# Patient Record
Sex: Female | Born: 1965
Health system: Southern US, Community
[De-identification: ages and names within clinical notes are randomized; demographics above are authoritative.]

## PROBLEM LIST (undated history)

## (undated) DIAGNOSIS — Z801 Family history of malignant neoplasm of trachea, bronchus and lung: Secondary | ICD-10-CM

## (undated) DIAGNOSIS — Z800 Family history of malignant neoplasm of digestive organs: Secondary | ICD-10-CM

## (undated) DIAGNOSIS — Z803 Family history of malignant neoplasm of breast: Secondary | ICD-10-CM

## (undated) DIAGNOSIS — E039 Hypothyroidism, unspecified: Secondary | ICD-10-CM

## (undated) DIAGNOSIS — R079 Chest pain, unspecified: Secondary | ICD-10-CM

## (undated) HISTORY — DX: Family history of malignant neoplasm of digestive organs: Z80.0

## (undated) HISTORY — DX: Family history of malignant neoplasm of breast: Z80.3

## (undated) HISTORY — DX: Family history of malignant neoplasm of trachea, bronchus and lung: Z80.1

---

## 1999-03-11 ENCOUNTER — Inpatient Hospital Stay (HOSPITAL_COMMUNITY): Admission: AD | Admit: 1999-03-11 | Discharge: 1999-03-14 | Payer: Self-pay | Admitting: Obstetrics and Gynecology

## 2001-08-02 ENCOUNTER — Other Ambulatory Visit: Admission: RE | Admit: 2001-08-02 | Discharge: 2001-08-02 | Payer: Self-pay | Admitting: Obstetrics and Gynecology

## 2001-09-29 ENCOUNTER — Ambulatory Visit (HOSPITAL_COMMUNITY): Admission: RE | Admit: 2001-09-29 | Discharge: 2001-09-29 | Payer: Self-pay | Admitting: Gastroenterology

## 2001-10-31 ENCOUNTER — Encounter: Admission: RE | Admit: 2001-10-31 | Discharge: 2002-01-29 | Payer: Self-pay | Admitting: Gastroenterology

## 2002-08-14 ENCOUNTER — Other Ambulatory Visit: Admission: RE | Admit: 2002-08-14 | Discharge: 2002-08-14 | Payer: Self-pay | Admitting: Obstetrics and Gynecology

## 2003-11-14 ENCOUNTER — Other Ambulatory Visit: Admission: RE | Admit: 2003-11-14 | Discharge: 2003-11-14 | Payer: Self-pay | Admitting: Obstetrics and Gynecology

## 2004-12-05 ENCOUNTER — Other Ambulatory Visit: Admission: RE | Admit: 2004-12-05 | Discharge: 2004-12-05 | Payer: Self-pay | Admitting: Obstetrics and Gynecology

## 2012-06-08 ENCOUNTER — Other Ambulatory Visit: Payer: Self-pay | Admitting: Family Medicine

## 2012-06-08 DIAGNOSIS — M79606 Pain in leg, unspecified: Secondary | ICD-10-CM

## 2012-06-13 ENCOUNTER — Ambulatory Visit
Admission: RE | Admit: 2012-06-13 | Discharge: 2012-06-13 | Disposition: A | Discharge status: Home / Self Care | Payer: Managed Care, Other (non HMO) | Source: Ambulatory Visit | Attending: Family Medicine | Admitting: Family Medicine

## 2012-06-13 DIAGNOSIS — M79606 Pain in leg, unspecified: Secondary | ICD-10-CM

## 2012-07-06 ENCOUNTER — Emergency Department (HOSPITAL_COMMUNITY): Payer: No Typology Code available for payment source

## 2012-07-06 ENCOUNTER — Emergency Department (HOSPITAL_COMMUNITY)
Admission: EM | Admit: 2012-07-06 | Discharge: 2012-07-06 | Disposition: A | Discharge status: Home / Self Care | Payer: No Typology Code available for payment source | Attending: Emergency Medicine | Admitting: Emergency Medicine

## 2012-07-06 DIAGNOSIS — S161XXA Strain of muscle, fascia and tendon at neck level, initial encounter: Secondary | ICD-10-CM

## 2012-07-06 DIAGNOSIS — S0990XA Unspecified injury of head, initial encounter: Secondary | ICD-10-CM | POA: Insufficient documentation

## 2012-07-06 DIAGNOSIS — S139XXA Sprain of joints and ligaments of unspecified parts of neck, initial encounter: Secondary | ICD-10-CM | POA: Insufficient documentation

## 2012-07-06 DIAGNOSIS — Y92410 Unspecified street and highway as the place of occurrence of the external cause: Secondary | ICD-10-CM | POA: Insufficient documentation

## 2012-07-06 DIAGNOSIS — Y9389 Activity, other specified: Secondary | ICD-10-CM | POA: Insufficient documentation

## 2012-07-06 MED ORDER — IBUPROFEN 800 MG PO TABS: 800.0000 mg | ORAL_TABLET | Freq: Three times a day (TID) | ORAL | Status: DC | PRN

## 2012-07-06 MED ORDER — HYDROCODONE-ACETAMINOPHEN 5-325 MG PO TABS: 1.0000 | ORAL_TABLET | Freq: Four times a day (QID) | ORAL | Status: DC | PRN
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
## 2012-07-06 NOTE — ED Provider Notes (Signed)
Medical screening examination/treatment/procedure(s) were performed by non-physician practitioner and as supervising physician I was immediately available for consultation/collaboration.  Shaleigh Laubscher, MD 07/06/12 2112 

## 2014-06-11 ENCOUNTER — Other Ambulatory Visit: Payer: Self-pay | Admitting: Obstetrics and Gynecology

## 2014-06-12 LAB — CYTOLOGY - PAP

## 2015-10-22 ENCOUNTER — Encounter (INDEPENDENT_AMBULATORY_CARE_PROVIDER_SITE_OTHER): Payer: Self-pay

## 2015-11-11 DIAGNOSIS — Z30433 Encounter for removal and reinsertion of intrauterine contraceptive device: Secondary | ICD-10-CM | POA: Diagnosis not present

## 2015-11-21 DIAGNOSIS — D225 Melanocytic nevi of trunk: Secondary | ICD-10-CM | POA: Diagnosis not present

## 2015-11-21 DIAGNOSIS — L719 Rosacea, unspecified: Secondary | ICD-10-CM | POA: Diagnosis not present

## 2015-11-21 DIAGNOSIS — Z86018 Personal history of other benign neoplasm: Secondary | ICD-10-CM | POA: Diagnosis not present

## 2015-12-27 DIAGNOSIS — D509 Iron deficiency anemia, unspecified: Secondary | ICD-10-CM | POA: Diagnosis not present

## 2015-12-27 DIAGNOSIS — N939 Abnormal uterine and vaginal bleeding, unspecified: Secondary | ICD-10-CM | POA: Diagnosis not present

## 2015-12-27 DIAGNOSIS — N951 Menopausal and female climacteric states: Secondary | ICD-10-CM | POA: Diagnosis not present

## 2015-12-27 DIAGNOSIS — Z1329 Encounter for screening for other suspected endocrine disorder: Secondary | ICD-10-CM | POA: Diagnosis not present

## 2015-12-27 DIAGNOSIS — R5383 Other fatigue: Secondary | ICD-10-CM | POA: Diagnosis not present

## 2016-01-03 DIAGNOSIS — S0502XA Injury of conjunctiva and corneal abrasion without foreign body, left eye, initial encounter: Secondary | ICD-10-CM | POA: Diagnosis not present

## 2016-02-11 DIAGNOSIS — Z1211 Encounter for screening for malignant neoplasm of colon: Secondary | ICD-10-CM | POA: Diagnosis not present

## 2016-02-11 DIAGNOSIS — D509 Iron deficiency anemia, unspecified: Secondary | ICD-10-CM | POA: Diagnosis not present

## 2016-02-11 DIAGNOSIS — K900 Celiac disease: Secondary | ICD-10-CM | POA: Diagnosis not present

## 2016-02-11 DIAGNOSIS — R194 Change in bowel habit: Secondary | ICD-10-CM | POA: Diagnosis not present

## 2016-03-30 DIAGNOSIS — M9905 Segmental and somatic dysfunction of pelvic region: Secondary | ICD-10-CM | POA: Diagnosis not present

## 2016-03-30 DIAGNOSIS — M9904 Segmental and somatic dysfunction of sacral region: Secondary | ICD-10-CM | POA: Diagnosis not present

## 2016-03-30 DIAGNOSIS — M9903 Segmental and somatic dysfunction of lumbar region: Secondary | ICD-10-CM | POA: Diagnosis not present

## 2016-03-30 DIAGNOSIS — M545 Low back pain: Secondary | ICD-10-CM | POA: Diagnosis not present

## 2016-04-02 DIAGNOSIS — M9904 Segmental and somatic dysfunction of sacral region: Secondary | ICD-10-CM | POA: Diagnosis not present

## 2016-04-02 DIAGNOSIS — M9905 Segmental and somatic dysfunction of pelvic region: Secondary | ICD-10-CM | POA: Diagnosis not present

## 2016-04-02 DIAGNOSIS — M9903 Segmental and somatic dysfunction of lumbar region: Secondary | ICD-10-CM | POA: Diagnosis not present

## 2016-04-02 DIAGNOSIS — M545 Low back pain: Secondary | ICD-10-CM | POA: Diagnosis not present

## 2016-04-06 DIAGNOSIS — M9903 Segmental and somatic dysfunction of lumbar region: Secondary | ICD-10-CM | POA: Diagnosis not present

## 2016-04-06 DIAGNOSIS — M545 Low back pain: Secondary | ICD-10-CM | POA: Diagnosis not present

## 2016-04-06 DIAGNOSIS — M9904 Segmental and somatic dysfunction of sacral region: Secondary | ICD-10-CM | POA: Diagnosis not present

## 2016-04-06 DIAGNOSIS — M9905 Segmental and somatic dysfunction of pelvic region: Secondary | ICD-10-CM | POA: Diagnosis not present

## 2016-04-07 DIAGNOSIS — M9903 Segmental and somatic dysfunction of lumbar region: Secondary | ICD-10-CM | POA: Diagnosis not present

## 2016-04-07 DIAGNOSIS — M545 Low back pain: Secondary | ICD-10-CM | POA: Diagnosis not present

## 2016-04-07 DIAGNOSIS — M9904 Segmental and somatic dysfunction of sacral region: Secondary | ICD-10-CM | POA: Diagnosis not present

## 2016-04-07 DIAGNOSIS — M9905 Segmental and somatic dysfunction of pelvic region: Secondary | ICD-10-CM | POA: Diagnosis not present

## 2016-04-08 DIAGNOSIS — R319 Hematuria, unspecified: Secondary | ICD-10-CM | POA: Diagnosis not present

## 2016-04-08 DIAGNOSIS — N390 Urinary tract infection, site not specified: Secondary | ICD-10-CM | POA: Diagnosis not present

## 2016-04-08 DIAGNOSIS — M9903 Segmental and somatic dysfunction of lumbar region: Secondary | ICD-10-CM | POA: Diagnosis not present

## 2016-04-08 DIAGNOSIS — M545 Low back pain: Secondary | ICD-10-CM | POA: Diagnosis not present

## 2016-04-08 DIAGNOSIS — M9905 Segmental and somatic dysfunction of pelvic region: Secondary | ICD-10-CM | POA: Diagnosis not present

## 2016-04-08 DIAGNOSIS — Z23 Encounter for immunization: Secondary | ICD-10-CM | POA: Diagnosis not present

## 2016-04-08 DIAGNOSIS — M9904 Segmental and somatic dysfunction of sacral region: Secondary | ICD-10-CM | POA: Diagnosis not present

## 2016-04-13 DIAGNOSIS — M9904 Segmental and somatic dysfunction of sacral region: Secondary | ICD-10-CM | POA: Diagnosis not present

## 2016-04-13 DIAGNOSIS — M9903 Segmental and somatic dysfunction of lumbar region: Secondary | ICD-10-CM | POA: Diagnosis not present

## 2016-04-13 DIAGNOSIS — M545 Low back pain: Secondary | ICD-10-CM | POA: Diagnosis not present

## 2016-04-13 DIAGNOSIS — M9905 Segmental and somatic dysfunction of pelvic region: Secondary | ICD-10-CM | POA: Diagnosis not present

## 2016-04-14 DIAGNOSIS — M9905 Segmental and somatic dysfunction of pelvic region: Secondary | ICD-10-CM | POA: Diagnosis not present

## 2016-04-14 DIAGNOSIS — M9903 Segmental and somatic dysfunction of lumbar region: Secondary | ICD-10-CM | POA: Diagnosis not present

## 2016-04-14 DIAGNOSIS — M545 Low back pain: Secondary | ICD-10-CM | POA: Diagnosis not present

## 2016-04-14 DIAGNOSIS — M9904 Segmental and somatic dysfunction of sacral region: Secondary | ICD-10-CM | POA: Diagnosis not present

## 2016-04-16 DIAGNOSIS — M9903 Segmental and somatic dysfunction of lumbar region: Secondary | ICD-10-CM | POA: Diagnosis not present

## 2016-04-16 DIAGNOSIS — M9905 Segmental and somatic dysfunction of pelvic region: Secondary | ICD-10-CM | POA: Diagnosis not present

## 2016-04-16 DIAGNOSIS — M545 Low back pain: Secondary | ICD-10-CM | POA: Diagnosis not present

## 2016-04-16 DIAGNOSIS — M9904 Segmental and somatic dysfunction of sacral region: Secondary | ICD-10-CM | POA: Diagnosis not present

## 2016-04-21 DIAGNOSIS — M545 Low back pain: Secondary | ICD-10-CM | POA: Diagnosis not present

## 2016-04-21 DIAGNOSIS — M9904 Segmental and somatic dysfunction of sacral region: Secondary | ICD-10-CM | POA: Diagnosis not present

## 2016-04-21 DIAGNOSIS — M9903 Segmental and somatic dysfunction of lumbar region: Secondary | ICD-10-CM | POA: Diagnosis not present

## 2016-04-21 DIAGNOSIS — M9905 Segmental and somatic dysfunction of pelvic region: Secondary | ICD-10-CM | POA: Diagnosis not present

## 2016-04-23 DIAGNOSIS — M9905 Segmental and somatic dysfunction of pelvic region: Secondary | ICD-10-CM | POA: Diagnosis not present

## 2016-04-23 DIAGNOSIS — M9903 Segmental and somatic dysfunction of lumbar region: Secondary | ICD-10-CM | POA: Diagnosis not present

## 2016-04-23 DIAGNOSIS — M545 Low back pain: Secondary | ICD-10-CM | POA: Diagnosis not present

## 2016-04-23 DIAGNOSIS — M9904 Segmental and somatic dysfunction of sacral region: Secondary | ICD-10-CM | POA: Diagnosis not present

## 2016-04-23 DIAGNOSIS — Z1211 Encounter for screening for malignant neoplasm of colon: Secondary | ICD-10-CM | POA: Diagnosis not present

## 2016-04-27 DIAGNOSIS — M9904 Segmental and somatic dysfunction of sacral region: Secondary | ICD-10-CM | POA: Diagnosis not present

## 2016-04-27 DIAGNOSIS — M9903 Segmental and somatic dysfunction of lumbar region: Secondary | ICD-10-CM | POA: Diagnosis not present

## 2016-04-27 DIAGNOSIS — M9905 Segmental and somatic dysfunction of pelvic region: Secondary | ICD-10-CM | POA: Diagnosis not present

## 2016-04-27 DIAGNOSIS — M545 Low back pain: Secondary | ICD-10-CM | POA: Diagnosis not present

## 2016-04-28 DIAGNOSIS — M9905 Segmental and somatic dysfunction of pelvic region: Secondary | ICD-10-CM | POA: Diagnosis not present

## 2016-04-28 DIAGNOSIS — M9903 Segmental and somatic dysfunction of lumbar region: Secondary | ICD-10-CM | POA: Diagnosis not present

## 2016-04-28 DIAGNOSIS — M9904 Segmental and somatic dysfunction of sacral region: Secondary | ICD-10-CM | POA: Diagnosis not present

## 2016-04-28 DIAGNOSIS — M545 Low back pain: Secondary | ICD-10-CM | POA: Diagnosis not present

## 2016-04-29 DIAGNOSIS — M545 Low back pain: Secondary | ICD-10-CM | POA: Diagnosis not present

## 2016-04-29 DIAGNOSIS — M9903 Segmental and somatic dysfunction of lumbar region: Secondary | ICD-10-CM | POA: Diagnosis not present

## 2016-04-29 DIAGNOSIS — M9905 Segmental and somatic dysfunction of pelvic region: Secondary | ICD-10-CM | POA: Diagnosis not present

## 2016-04-29 DIAGNOSIS — M9904 Segmental and somatic dysfunction of sacral region: Secondary | ICD-10-CM | POA: Diagnosis not present

## 2016-04-30 DIAGNOSIS — R799 Abnormal finding of blood chemistry, unspecified: Secondary | ICD-10-CM | POA: Diagnosis not present

## 2016-05-04 DIAGNOSIS — M9905 Segmental and somatic dysfunction of pelvic region: Secondary | ICD-10-CM | POA: Diagnosis not present

## 2016-05-04 DIAGNOSIS — M9904 Segmental and somatic dysfunction of sacral region: Secondary | ICD-10-CM | POA: Diagnosis not present

## 2016-05-04 DIAGNOSIS — M545 Low back pain: Secondary | ICD-10-CM | POA: Diagnosis not present

## 2016-05-04 DIAGNOSIS — M9903 Segmental and somatic dysfunction of lumbar region: Secondary | ICD-10-CM | POA: Diagnosis not present

## 2016-05-06 DIAGNOSIS — M9903 Segmental and somatic dysfunction of lumbar region: Secondary | ICD-10-CM | POA: Diagnosis not present

## 2016-05-06 DIAGNOSIS — M9904 Segmental and somatic dysfunction of sacral region: Secondary | ICD-10-CM | POA: Diagnosis not present

## 2016-05-06 DIAGNOSIS — M545 Low back pain: Secondary | ICD-10-CM | POA: Diagnosis not present

## 2016-05-06 DIAGNOSIS — M9905 Segmental and somatic dysfunction of pelvic region: Secondary | ICD-10-CM | POA: Diagnosis not present

## 2016-05-12 DIAGNOSIS — M9904 Segmental and somatic dysfunction of sacral region: Secondary | ICD-10-CM | POA: Diagnosis not present

## 2016-05-12 DIAGNOSIS — M545 Low back pain: Secondary | ICD-10-CM | POA: Diagnosis not present

## 2016-05-12 DIAGNOSIS — M9905 Segmental and somatic dysfunction of pelvic region: Secondary | ICD-10-CM | POA: Diagnosis not present

## 2016-05-12 DIAGNOSIS — M9903 Segmental and somatic dysfunction of lumbar region: Secondary | ICD-10-CM | POA: Diagnosis not present

## 2016-05-13 DIAGNOSIS — M9905 Segmental and somatic dysfunction of pelvic region: Secondary | ICD-10-CM | POA: Diagnosis not present

## 2016-05-13 DIAGNOSIS — M9903 Segmental and somatic dysfunction of lumbar region: Secondary | ICD-10-CM | POA: Diagnosis not present

## 2016-05-13 DIAGNOSIS — M545 Low back pain: Secondary | ICD-10-CM | POA: Diagnosis not present

## 2016-05-13 DIAGNOSIS — M9904 Segmental and somatic dysfunction of sacral region: Secondary | ICD-10-CM | POA: Diagnosis not present

## 2016-05-19 DIAGNOSIS — M9904 Segmental and somatic dysfunction of sacral region: Secondary | ICD-10-CM | POA: Diagnosis not present

## 2016-05-19 DIAGNOSIS — M9903 Segmental and somatic dysfunction of lumbar region: Secondary | ICD-10-CM | POA: Diagnosis not present

## 2016-05-19 DIAGNOSIS — M545 Low back pain: Secondary | ICD-10-CM | POA: Diagnosis not present

## 2016-05-19 DIAGNOSIS — M9905 Segmental and somatic dysfunction of pelvic region: Secondary | ICD-10-CM | POA: Diagnosis not present

## 2016-05-21 DIAGNOSIS — M9905 Segmental and somatic dysfunction of pelvic region: Secondary | ICD-10-CM | POA: Diagnosis not present

## 2016-05-21 DIAGNOSIS — M545 Low back pain: Secondary | ICD-10-CM | POA: Diagnosis not present

## 2016-05-21 DIAGNOSIS — M9904 Segmental and somatic dysfunction of sacral region: Secondary | ICD-10-CM | POA: Diagnosis not present

## 2016-05-21 DIAGNOSIS — M9903 Segmental and somatic dysfunction of lumbar region: Secondary | ICD-10-CM | POA: Diagnosis not present

## 2016-05-25 DIAGNOSIS — M9904 Segmental and somatic dysfunction of sacral region: Secondary | ICD-10-CM | POA: Diagnosis not present

## 2016-05-25 DIAGNOSIS — M9905 Segmental and somatic dysfunction of pelvic region: Secondary | ICD-10-CM | POA: Diagnosis not present

## 2016-05-25 DIAGNOSIS — M545 Low back pain: Secondary | ICD-10-CM | POA: Diagnosis not present

## 2016-05-25 DIAGNOSIS — M9903 Segmental and somatic dysfunction of lumbar region: Secondary | ICD-10-CM | POA: Diagnosis not present

## 2016-05-26 DIAGNOSIS — M9905 Segmental and somatic dysfunction of pelvic region: Secondary | ICD-10-CM | POA: Diagnosis not present

## 2016-05-26 DIAGNOSIS — M9903 Segmental and somatic dysfunction of lumbar region: Secondary | ICD-10-CM | POA: Diagnosis not present

## 2016-05-26 DIAGNOSIS — M9904 Segmental and somatic dysfunction of sacral region: Secondary | ICD-10-CM | POA: Diagnosis not present

## 2016-05-26 DIAGNOSIS — M545 Low back pain: Secondary | ICD-10-CM | POA: Diagnosis not present

## 2016-06-01 DIAGNOSIS — M545 Low back pain: Secondary | ICD-10-CM | POA: Diagnosis not present

## 2016-06-01 DIAGNOSIS — M9903 Segmental and somatic dysfunction of lumbar region: Secondary | ICD-10-CM | POA: Diagnosis not present

## 2016-06-01 DIAGNOSIS — M9904 Segmental and somatic dysfunction of sacral region: Secondary | ICD-10-CM | POA: Diagnosis not present

## 2016-06-01 DIAGNOSIS — M9905 Segmental and somatic dysfunction of pelvic region: Secondary | ICD-10-CM | POA: Diagnosis not present

## 2016-06-03 DIAGNOSIS — M545 Low back pain: Secondary | ICD-10-CM | POA: Diagnosis not present

## 2016-06-03 DIAGNOSIS — M9904 Segmental and somatic dysfunction of sacral region: Secondary | ICD-10-CM | POA: Diagnosis not present

## 2016-06-03 DIAGNOSIS — M9903 Segmental and somatic dysfunction of lumbar region: Secondary | ICD-10-CM | POA: Diagnosis not present

## 2016-06-03 DIAGNOSIS — M9905 Segmental and somatic dysfunction of pelvic region: Secondary | ICD-10-CM | POA: Diagnosis not present

## 2016-06-08 DIAGNOSIS — M9905 Segmental and somatic dysfunction of pelvic region: Secondary | ICD-10-CM | POA: Diagnosis not present

## 2016-06-08 DIAGNOSIS — M9904 Segmental and somatic dysfunction of sacral region: Secondary | ICD-10-CM | POA: Diagnosis not present

## 2016-06-08 DIAGNOSIS — M545 Low back pain: Secondary | ICD-10-CM | POA: Diagnosis not present

## 2016-06-08 DIAGNOSIS — M9903 Segmental and somatic dysfunction of lumbar region: Secondary | ICD-10-CM | POA: Diagnosis not present

## 2016-06-11 DIAGNOSIS — M545 Low back pain: Secondary | ICD-10-CM | POA: Diagnosis not present

## 2016-06-11 DIAGNOSIS — M9905 Segmental and somatic dysfunction of pelvic region: Secondary | ICD-10-CM | POA: Diagnosis not present

## 2016-06-11 DIAGNOSIS — M9903 Segmental and somatic dysfunction of lumbar region: Secondary | ICD-10-CM | POA: Diagnosis not present

## 2016-06-11 DIAGNOSIS — M9904 Segmental and somatic dysfunction of sacral region: Secondary | ICD-10-CM | POA: Diagnosis not present

## 2016-06-23 DIAGNOSIS — K640 First degree hemorrhoids: Secondary | ICD-10-CM | POA: Diagnosis not present

## 2016-06-23 DIAGNOSIS — D485 Neoplasm of uncertain behavior of skin: Secondary | ICD-10-CM | POA: Diagnosis not present

## 2016-06-23 DIAGNOSIS — K5289 Other specified noninfective gastroenteritis and colitis: Secondary | ICD-10-CM | POA: Diagnosis not present

## 2016-06-23 DIAGNOSIS — K900 Celiac disease: Secondary | ICD-10-CM | POA: Diagnosis not present

## 2016-06-23 DIAGNOSIS — L821 Other seborrheic keratosis: Secondary | ICD-10-CM | POA: Diagnosis not present

## 2016-06-23 DIAGNOSIS — L820 Inflamed seborrheic keratosis: Secondary | ICD-10-CM | POA: Diagnosis not present

## 2016-06-25 DIAGNOSIS — K900 Celiac disease: Secondary | ICD-10-CM | POA: Diagnosis not present

## 2016-06-25 DIAGNOSIS — K5289 Other specified noninfective gastroenteritis and colitis: Secondary | ICD-10-CM | POA: Diagnosis not present

## 2016-09-23 DIAGNOSIS — D225 Melanocytic nevi of trunk: Secondary | ICD-10-CM | POA: Diagnosis not present

## 2016-09-23 DIAGNOSIS — L570 Actinic keratosis: Secondary | ICD-10-CM | POA: Diagnosis not present

## 2016-09-23 DIAGNOSIS — Z86018 Personal history of other benign neoplasm: Secondary | ICD-10-CM | POA: Diagnosis not present

## 2016-09-23 DIAGNOSIS — L719 Rosacea, unspecified: Secondary | ICD-10-CM | POA: Diagnosis not present

## 2016-11-02 DIAGNOSIS — D485 Neoplasm of uncertain behavior of skin: Secondary | ICD-10-CM | POA: Diagnosis not present

## 2016-11-05 DIAGNOSIS — Z6822 Body mass index (BMI) 22.0-22.9, adult: Secondary | ICD-10-CM | POA: Diagnosis not present

## 2016-11-05 DIAGNOSIS — Z01419 Encounter for gynecological examination (general) (routine) without abnormal findings: Secondary | ICD-10-CM | POA: Diagnosis not present

## 2016-11-05 DIAGNOSIS — E039 Hypothyroidism, unspecified: Secondary | ICD-10-CM | POA: Diagnosis not present

## 2016-11-05 DIAGNOSIS — N951 Menopausal and female climacteric states: Secondary | ICD-10-CM | POA: Diagnosis not present

## 2016-11-05 DIAGNOSIS — Z1231 Encounter for screening mammogram for malignant neoplasm of breast: Secondary | ICD-10-CM | POA: Diagnosis not present

## 2016-12-10 DIAGNOSIS — M7981 Nontraumatic hematoma of soft tissue: Secondary | ICD-10-CM | POA: Diagnosis not present

## 2017-01-11 DIAGNOSIS — L570 Actinic keratosis: Secondary | ICD-10-CM | POA: Diagnosis not present

## 2017-02-03 DIAGNOSIS — M25552 Pain in left hip: Secondary | ICD-10-CM | POA: Diagnosis not present

## 2017-02-03 DIAGNOSIS — M7061 Trochanteric bursitis, right hip: Secondary | ICD-10-CM | POA: Diagnosis not present

## 2017-02-03 DIAGNOSIS — M25551 Pain in right hip: Secondary | ICD-10-CM | POA: Diagnosis not present

## 2017-02-03 DIAGNOSIS — M7062 Trochanteric bursitis, left hip: Secondary | ICD-10-CM | POA: Diagnosis not present

## 2017-06-28 DIAGNOSIS — R079 Chest pain, unspecified: Secondary | ICD-10-CM | POA: Diagnosis not present

## 2017-06-28 DIAGNOSIS — R0789 Other chest pain: Secondary | ICD-10-CM | POA: Diagnosis not present

## 2017-06-30 ENCOUNTER — Ambulatory Visit
Admission: RE | Admit: 2017-06-30 | Discharge: 2017-06-30 | Disposition: A | Discharge status: Home / Self Care | Payer: BLUE CROSS/BLUE SHIELD | Source: Ambulatory Visit | Attending: Family Medicine | Admitting: Family Medicine

## 2017-06-30 ENCOUNTER — Other Ambulatory Visit: Payer: Self-pay | Admitting: Family Medicine

## 2017-06-30 DIAGNOSIS — R0789 Other chest pain: Secondary | ICD-10-CM | POA: Diagnosis not present

## 2017-06-30 DIAGNOSIS — R079 Chest pain, unspecified: Secondary | ICD-10-CM

## 2017-09-10 DIAGNOSIS — R6889 Other general symptoms and signs: Secondary | ICD-10-CM | POA: Diagnosis not present

## 2017-09-10 DIAGNOSIS — J101 Influenza due to other identified influenza virus with other respiratory manifestations: Secondary | ICD-10-CM | POA: Diagnosis not present

## 2017-09-23 DIAGNOSIS — L719 Rosacea, unspecified: Secondary | ICD-10-CM | POA: Diagnosis not present

## 2017-09-23 DIAGNOSIS — Z86018 Personal history of other benign neoplasm: Secondary | ICD-10-CM | POA: Diagnosis not present

## 2017-09-23 DIAGNOSIS — L570 Actinic keratosis: Secondary | ICD-10-CM | POA: Diagnosis not present

## 2017-09-23 DIAGNOSIS — D225 Melanocytic nevi of trunk: Secondary | ICD-10-CM | POA: Diagnosis not present

## 2017-10-13 DIAGNOSIS — J029 Acute pharyngitis, unspecified: Secondary | ICD-10-CM | POA: Diagnosis not present

## 2017-10-13 DIAGNOSIS — J020 Streptococcal pharyngitis: Secondary | ICD-10-CM | POA: Diagnosis not present

## 2017-11-11 DIAGNOSIS — Z1231 Encounter for screening mammogram for malignant neoplasm of breast: Secondary | ICD-10-CM | POA: Diagnosis not present

## 2017-11-11 DIAGNOSIS — Z01419 Encounter for gynecological examination (general) (routine) without abnormal findings: Secondary | ICD-10-CM | POA: Diagnosis not present

## 2017-11-11 DIAGNOSIS — Z6822 Body mass index (BMI) 22.0-22.9, adult: Secondary | ICD-10-CM | POA: Diagnosis not present

## 2017-12-14 DIAGNOSIS — M5412 Radiculopathy, cervical region: Secondary | ICD-10-CM | POA: Diagnosis not present

## 2017-12-14 DIAGNOSIS — M542 Cervicalgia: Secondary | ICD-10-CM | POA: Diagnosis not present

## 2018-04-19 DIAGNOSIS — Z23 Encounter for immunization: Secondary | ICD-10-CM | POA: Diagnosis not present

## 2018-04-19 DIAGNOSIS — L570 Actinic keratosis: Secondary | ICD-10-CM | POA: Diagnosis not present

## 2018-04-19 DIAGNOSIS — D485 Neoplasm of uncertain behavior of skin: Secondary | ICD-10-CM | POA: Diagnosis not present

## 2018-04-19 DIAGNOSIS — D0461 Carcinoma in situ of skin of right upper limb, including shoulder: Secondary | ICD-10-CM | POA: Diagnosis not present

## 2018-05-24 DIAGNOSIS — D0461 Carcinoma in situ of skin of right upper limb, including shoulder: Secondary | ICD-10-CM | POA: Diagnosis not present

## 2018-06-14 DIAGNOSIS — L570 Actinic keratosis: Secondary | ICD-10-CM | POA: Diagnosis not present

## 2018-06-14 DIAGNOSIS — Z86018 Personal history of other benign neoplasm: Secondary | ICD-10-CM | POA: Diagnosis not present

## 2018-06-14 DIAGNOSIS — L821 Other seborrheic keratosis: Secondary | ICD-10-CM | POA: Diagnosis not present

## 2018-06-14 DIAGNOSIS — D225 Melanocytic nevi of trunk: Secondary | ICD-10-CM | POA: Diagnosis not present

## 2018-09-15 DIAGNOSIS — D485 Neoplasm of uncertain behavior of skin: Secondary | ICD-10-CM | POA: Diagnosis not present

## 2018-09-15 DIAGNOSIS — L430 Hypertrophic lichen planus: Secondary | ICD-10-CM | POA: Diagnosis not present

## 2018-11-16 DIAGNOSIS — M722 Plantar fascial fibromatosis: Secondary | ICD-10-CM | POA: Diagnosis not present

## 2019-01-25 DIAGNOSIS — Z6823 Body mass index (BMI) 23.0-23.9, adult: Secondary | ICD-10-CM | POA: Diagnosis not present

## 2019-01-25 DIAGNOSIS — Z1231 Encounter for screening mammogram for malignant neoplasm of breast: Secondary | ICD-10-CM | POA: Diagnosis not present

## 2019-01-25 DIAGNOSIS — Z01419 Encounter for gynecological examination (general) (routine) without abnormal findings: Secondary | ICD-10-CM | POA: Diagnosis not present

## 2019-01-25 DIAGNOSIS — N951 Menopausal and female climacteric states: Secondary | ICD-10-CM | POA: Diagnosis not present

## 2019-01-31 DIAGNOSIS — M71572 Other bursitis, not elsewhere classified, left ankle and foot: Secondary | ICD-10-CM | POA: Diagnosis not present

## 2019-01-31 DIAGNOSIS — M7732 Calcaneal spur, left foot: Secondary | ICD-10-CM | POA: Diagnosis not present

## 2019-01-31 DIAGNOSIS — M722 Plantar fascial fibromatosis: Secondary | ICD-10-CM | POA: Diagnosis not present

## 2019-02-07 DIAGNOSIS — M71572 Other bursitis, not elsewhere classified, left ankle and foot: Secondary | ICD-10-CM | POA: Diagnosis not present

## 2019-02-07 DIAGNOSIS — M722 Plantar fascial fibromatosis: Secondary | ICD-10-CM | POA: Diagnosis not present

## 2019-02-20 DIAGNOSIS — M50322 Other cervical disc degeneration at C5-C6 level: Secondary | ICD-10-CM | POA: Diagnosis not present

## 2019-02-20 DIAGNOSIS — M2578 Osteophyte, vertebrae: Secondary | ICD-10-CM | POA: Diagnosis not present

## 2019-02-20 DIAGNOSIS — M47812 Spondylosis without myelopathy or radiculopathy, cervical region: Secondary | ICD-10-CM | POA: Diagnosis not present

## 2019-02-20 DIAGNOSIS — R51 Headache: Secondary | ICD-10-CM | POA: Diagnosis not present

## 2019-02-23 DIAGNOSIS — M71572 Other bursitis, not elsewhere classified, left ankle and foot: Secondary | ICD-10-CM | POA: Diagnosis not present

## 2019-02-23 DIAGNOSIS — M722 Plantar fascial fibromatosis: Secondary | ICD-10-CM | POA: Diagnosis not present

## 2019-03-06 DIAGNOSIS — M71572 Other bursitis, not elsewhere classified, left ankle and foot: Secondary | ICD-10-CM | POA: Diagnosis not present

## 2019-03-06 DIAGNOSIS — M722 Plantar fascial fibromatosis: Secondary | ICD-10-CM | POA: Diagnosis not present

## 2019-04-27 DIAGNOSIS — J029 Acute pharyngitis, unspecified: Secondary | ICD-10-CM | POA: Diagnosis not present

## 2019-04-28 DIAGNOSIS — R899 Unspecified abnormal finding in specimens from other organs, systems and tissues: Secondary | ICD-10-CM | POA: Diagnosis not present

## 2019-05-11 DIAGNOSIS — Z13220 Encounter for screening for lipoid disorders: Secondary | ICD-10-CM | POA: Diagnosis not present

## 2019-05-11 DIAGNOSIS — Z0000 Encounter for general adult medical examination without abnormal findings: Secondary | ICD-10-CM | POA: Diagnosis not present

## 2019-05-11 DIAGNOSIS — D509 Iron deficiency anemia, unspecified: Secondary | ICD-10-CM | POA: Diagnosis not present

## 2019-06-13 DIAGNOSIS — E039 Hypothyroidism, unspecified: Secondary | ICD-10-CM | POA: Diagnosis not present

## 2019-10-24 DIAGNOSIS — L578 Other skin changes due to chronic exposure to nonionizing radiation: Secondary | ICD-10-CM | POA: Diagnosis not present

## 2019-10-24 DIAGNOSIS — D225 Melanocytic nevi of trunk: Secondary | ICD-10-CM | POA: Diagnosis not present

## 2019-10-24 DIAGNOSIS — L821 Other seborrheic keratosis: Secondary | ICD-10-CM | POA: Diagnosis not present

## 2019-10-24 DIAGNOSIS — L570 Actinic keratosis: Secondary | ICD-10-CM | POA: Diagnosis not present

## 2019-11-20 DIAGNOSIS — M722 Plantar fascial fibromatosis: Secondary | ICD-10-CM | POA: Diagnosis not present

## 2019-12-11 DIAGNOSIS — E039 Hypothyroidism, unspecified: Secondary | ICD-10-CM | POA: Diagnosis not present

## 2019-12-26 DIAGNOSIS — F5101 Primary insomnia: Secondary | ICD-10-CM | POA: Diagnosis not present

## 2020-02-22 DIAGNOSIS — Z20822 Contact with and (suspected) exposure to covid-19: Secondary | ICD-10-CM | POA: Diagnosis not present

## 2020-03-18 DIAGNOSIS — Z1231 Encounter for screening mammogram for malignant neoplasm of breast: Secondary | ICD-10-CM | POA: Diagnosis not present

## 2020-03-18 DIAGNOSIS — Z01419 Encounter for gynecological examination (general) (routine) without abnormal findings: Secondary | ICD-10-CM | POA: Diagnosis not present

## 2020-03-18 DIAGNOSIS — Z30432 Encounter for removal of intrauterine contraceptive device: Secondary | ICD-10-CM | POA: Diagnosis not present

## 2020-03-18 DIAGNOSIS — R58 Hemorrhage, not elsewhere classified: Secondary | ICD-10-CM | POA: Diagnosis not present

## 2020-03-18 DIAGNOSIS — Z131 Encounter for screening for diabetes mellitus: Secondary | ICD-10-CM | POA: Diagnosis not present

## 2020-03-18 DIAGNOSIS — Z13220 Encounter for screening for lipoid disorders: Secondary | ICD-10-CM | POA: Diagnosis not present

## 2020-03-18 DIAGNOSIS — Z6821 Body mass index (BMI) 21.0-21.9, adult: Secondary | ICD-10-CM | POA: Diagnosis not present

## 2020-03-18 DIAGNOSIS — Z1329 Encounter for screening for other suspected endocrine disorder: Secondary | ICD-10-CM | POA: Diagnosis not present

## 2020-03-18 DIAGNOSIS — Z13228 Encounter for screening for other metabolic disorders: Secondary | ICD-10-CM | POA: Diagnosis not present

## 2020-03-20 ENCOUNTER — Other Ambulatory Visit: Payer: Self-pay | Admitting: Obstetrics and Gynecology

## 2020-03-20 DIAGNOSIS — R928 Other abnormal and inconclusive findings on diagnostic imaging of breast: Secondary | ICD-10-CM

## 2020-03-26 ENCOUNTER — Other Ambulatory Visit: Payer: Self-pay

## 2020-03-26 DIAGNOSIS — Z20822 Contact with and (suspected) exposure to covid-19: Secondary | ICD-10-CM

## 2020-03-27 ENCOUNTER — Ambulatory Visit
Admission: RE | Admit: 2020-03-27 | Discharge: 2020-03-27 | Disposition: A | Discharge status: Home / Self Care | Payer: Self-pay | Source: Ambulatory Visit | Attending: Obstetrics and Gynecology | Admitting: Obstetrics and Gynecology

## 2020-03-27 ENCOUNTER — Other Ambulatory Visit: Payer: Self-pay

## 2020-03-27 ENCOUNTER — Other Ambulatory Visit: Payer: Self-pay | Admitting: Obstetrics and Gynecology

## 2020-03-27 DIAGNOSIS — R921 Mammographic calcification found on diagnostic imaging of breast: Secondary | ICD-10-CM

## 2020-03-27 DIAGNOSIS — Z1159 Encounter for screening for other viral diseases: Secondary | ICD-10-CM | POA: Diagnosis not present

## 2020-03-27 DIAGNOSIS — R928 Other abnormal and inconclusive findings on diagnostic imaging of breast: Secondary | ICD-10-CM

## 2020-03-28 LAB — SARS-COV-2, NAA 2 DAY TAT

## 2020-03-28 LAB — NOVEL CORONAVIRUS, NAA: SARS-CoV-2, NAA: NOT DETECTED

## 2020-04-11 ENCOUNTER — Other Ambulatory Visit: Payer: Self-pay | Admitting: Obstetrics and Gynecology

## 2020-04-11 ENCOUNTER — Ambulatory Visit
Admission: RE | Admit: 2020-04-11 | Discharge: 2020-04-11 | Disposition: A | Discharge status: Home / Self Care | Payer: BC Managed Care – PPO | Source: Ambulatory Visit | Attending: Obstetrics and Gynecology | Admitting: Obstetrics and Gynecology

## 2020-04-11 ENCOUNTER — Other Ambulatory Visit: Payer: Self-pay

## 2020-04-11 DIAGNOSIS — R921 Mammographic calcification found on diagnostic imaging of breast: Secondary | ICD-10-CM

## 2020-04-11 DIAGNOSIS — N6489 Other specified disorders of breast: Secondary | ICD-10-CM | POA: Diagnosis not present

## 2020-04-11 DIAGNOSIS — N6092 Unspecified benign mammary dysplasia of left breast: Secondary | ICD-10-CM | POA: Diagnosis not present

## 2020-04-26 ENCOUNTER — Other Ambulatory Visit: Payer: Self-pay | Admitting: Surgery

## 2020-04-26 DIAGNOSIS — N6092 Unspecified benign mammary dysplasia of left breast: Secondary | ICD-10-CM

## 2020-05-06 ENCOUNTER — Telehealth: Payer: Self-pay | Admitting: Genetic Counselor

## 2020-05-06 ENCOUNTER — Other Ambulatory Visit: Payer: Self-pay | Admitting: Genetic Counselor

## 2020-05-06 DIAGNOSIS — Z803 Family history of malignant neoplasm of breast: Secondary | ICD-10-CM

## 2020-05-06 NOTE — Telephone Encounter (Signed)
Received a genetic counseling referral from Dr. Ninfa Linden for hisgh rish for brca. Carrie Vang returned my call and has been scheduled to see [name] on 11/22 at 9am. Pt aware to arrive 15 minutes early.

## 2020-05-07 ENCOUNTER — Encounter: Payer: BC Managed Care – PPO | Admitting: Genetic Counselor

## 2020-05-09 ENCOUNTER — Other Ambulatory Visit: Payer: Self-pay

## 2020-05-09 ENCOUNTER — Inpatient Hospital Stay: Payer: BC Managed Care – PPO | Attending: Genetic Counselor | Admitting: Genetic Counselor

## 2020-05-09 ENCOUNTER — Inpatient Hospital Stay: Payer: BC Managed Care – PPO

## 2020-05-09 DIAGNOSIS — Z315 Encounter for genetic counseling: Secondary | ICD-10-CM

## 2020-05-09 DIAGNOSIS — Z800 Family history of malignant neoplasm of digestive organs: Secondary | ICD-10-CM

## 2020-05-09 DIAGNOSIS — Z801 Family history of malignant neoplasm of trachea, bronchus and lung: Secondary | ICD-10-CM

## 2020-05-09 DIAGNOSIS — Z803 Family history of malignant neoplasm of breast: Secondary | ICD-10-CM | POA: Diagnosis not present

## 2020-05-09 LAB — GENETIC SCREENING ORDER

## 2020-05-13 ENCOUNTER — Encounter: Payer: Self-pay | Admitting: Genetic Counselor

## 2020-05-13 DIAGNOSIS — Z0000 Encounter for general adult medical examination without abnormal findings: Secondary | ICD-10-CM | POA: Diagnosis not present

## 2020-05-13 DIAGNOSIS — Z803 Family history of malignant neoplasm of breast: Secondary | ICD-10-CM | POA: Insufficient documentation

## 2020-05-13 DIAGNOSIS — K900 Celiac disease: Secondary | ICD-10-CM | POA: Diagnosis not present

## 2020-05-13 DIAGNOSIS — Z801 Family history of malignant neoplasm of trachea, bronchus and lung: Secondary | ICD-10-CM | POA: Insufficient documentation

## 2020-05-13 DIAGNOSIS — Z800 Family history of malignant neoplasm of digestive organs: Secondary | ICD-10-CM | POA: Insufficient documentation

## 2020-05-13 DIAGNOSIS — F5101 Primary insomnia: Secondary | ICD-10-CM | POA: Diagnosis not present

## 2020-05-13 DIAGNOSIS — E039 Hypothyroidism, unspecified: Secondary | ICD-10-CM | POA: Diagnosis not present

## 2020-05-13 NOTE — Progress Notes (Signed)
REFERRING PROVIDER: Coralie Keens, MD [address] [place],  [place] [address]  PRIMARY PROVIDER:  Carol Ada, MD  PRIMARY REASON FOR VISIT:  1. Family history of breast cancer   2. Family history of oral cancer   3. Family history of throat cancer   4. Family history of lung cancer      HISTORY OF PRESENT ILLNESS:   [name], a 54 y.o. female, was seen for a [facility] cancer genetics consultation at the request of Dr. Ninfa Linden due to a family history of cancer.  Carrie Vang presents to clinic today to discuss the possibility of a hereditary predisposition to cancer, genetic testing, and to further clarify her future cancer risks, as well as potential cancer risks for family members.   Carrie Vang does not have a personal history of cancer. She has recently been diagnosed with atypical ductal hyperplasia of the left breast, for which she is planning to have a lumpectomy.  RISK FACTORS:  Menarche was at age 63.  First live birth at age 37.  OCP use for approximately 10 years.  Ovaries intact: yes.  Hysterectomy: no.  Menopausal status: postmenopausal.  HRT use: yes. Colonoscopy: yes; 2020, return in 5 yrs. Mammogram within the last year: yes. Any excessive radiation exposure in the past: no   Past Medical History:  Diagnosis Date  . Family history of breast cancer   . Family history of lung cancer   . Family history of oral cancer   . Family history of throat cancer     No past surgical history on file.  Social History   Socioeconomic History  . Marital status: Married    Spouse name: Not on file  . Number of children: Not on file  . Years of education: Not on file  . Highest education level: Not on file  Occupational History  . Not on file  Tobacco Use  . Smoking status: Not on file  Substance and Sexual Activity  . Alcohol use: Not on file  . Drug use: Not on file  . Sexual activity: Not on file  Other Topics Concern  . Not on file    Social History Narrative  . Not on file   Social Determinants of Health   Financial Resource Strain:   . Difficulty of Paying Living Expenses: Not on file  Food Insecurity:   . Worried About Charity fundraiser in the Last Year: Not on file  . Ran Out of Food in the Last Year: Not on file  Transportation Needs:   . Lack of Transportation (Medical): Not on file  . Lack of Transportation (Non-Medical): Not on file  Physical Activity:   . Days of Exercise per Week: Not on file  . Minutes of Exercise per Session: Not on file  Stress:   . Feeling of Stress : Not on file  Social Connections:   . Frequency of Communication with Friends and Family: Not on file  . Frequency of Social Gatherings with Friends and Family: Not on file  . Attends Religious Services: Not on file  . Active Member of Clubs or Organizations: Not on file  . Attends Archivist Meetings: Not on file  . Marital Status: Not on file     FAMILY HISTORY:  We obtained a detailed, 4-generation family history.  Significant diagnoses are listed below: Family History  Problem Relation Age of Onset  . Breast cancer Mother 50  . Cancer Mother 67  oral  . Throat cancer Maternal Grandfather 44  . Lung cancer Maternal Grandfather 50  . Stroke Paternal Grandmother        multiple  . Heart attack Paternal Grandfather   . Factor V Leiden deficiency Cousin        paternal first cousins   Carrie Vang has two daughters (ages 68 and 86) and one son (age 66). She has one sister (age 81). None of these family members have had cancer.  Carrie Vang's mother died at the age of 37 from breast cancer and oral cancer. Her mother did not have any siblings. Her maternal grandmother died at the age of 54. Her maternal grandfather died at the age of 15 from throat cancer and lung cancer.   Carrie Vang's father is 41 and has not had cancer. She has one paternal aunt. Her paternal grandmother died older than 79 and had a  history of multiple strokes. Her paternal grandfather died older than 91 from a heart attack.  Carrie Vang is unaware of previous family history of genetic testing for hereditary cancer risks. Patient's maternal ancestors are of [place] descent, and paternal ancestors are of [place] and [place] descent. There is no reported Ashkenazi Jewish ancestry. There is no known consanguinity.  GENETIC COUNSELING ASSESSMENT: Ms. Amey is a 54 y.o. female with a family history of breast cancer which is not strongly suggestive of a hereditary cancer syndrome. We, therefore, discussed and recommended the following at today's visit.   DISCUSSION:  We discussed that, in general, most cancer is not inherited in families, but instead is sporadic or familial. Sporadic cancers occur by chance and typically happen at older ages (>50 years) as this type of cancer is caused by genetic changes acquired during an individual's lifetime. Some families have more cancers than would be expected by chance; however, the ages or types of cancer are not consistent with a known genetic mutation or known genetic mutations have been ruled out. This type of familial cancer is thought to be due to a combination of multiple genetic, environmental, hormonal, and lifestyle factors. While this combination of factors likely increases the risk of cancer, the exact source of this risk is not currently identifiable or testable.    We discussed that approximately 5-10% of breast cancer is hereditary, meaning that it is due to a mutation in a single gene that is passed down from generation to generation in a family. Most hereditary cases of breast cancer are associated with the BRCA1 and BRCA2 genes. There are other genes that can be associated an increased risk for breast cancer, including ATM, CHEK2, PALB2, etc. We discussed that testing can be beneficial for several reasons, including knowing about other cancer risks, identifying  potential screening and risk-reduction options that may be appropriate, and to understand if other family members could be at risk for cancer and allow them to undergo genetic testing.   We reviewed the characteristics, features and inheritance patterns of hereditary cancer syndromes. We discussed with Ms. Upshur that the family history does not meet insurance or NCCN criteria for genetic testing and, therefore, is not highly consistent with a familial hereditary cancer syndrome.  We feel she is at low risk to harbor a gene mutation associated with such a condition. Ms. Coburn would still like to proceed with testing, and understands that the self-pay price for genetic testing is $250. We therefore discussed genetic testing, the process of testing, insurance coverage and turn-around-time for results. We discussed  the implications of a negative, positive and/or variant of uncertain significant result. In order to get genetic test results in a timely manner so that Ms. Ebner can use these genetic test results for surgical decisions, we recommended Ms. Nerio pursue genetic testing for the [facility] Breast Cancer STAT panel. Once complete, we recommend Ms. Alperin pursue reflex genetic testing to the Common Hereditary Cancers panel.   The Breast Cancer STAT Panel offered by [facility] includes sequencing and deletion/duplication analysis for the following 9 genes:  ATM, BRCA1, BRCA2, CDH1, CHEK2, PALB2, PTEN, STK11 and TP53. The Common Hereditary Cancers Panel offered by [facility] includes sequencing and/or deletion duplication testing of the following 48 genes: APC, ATM, AXIN2, BARD1, BMPR1A, BRCA1, BRCA2, BRIP1, CDH1, CDK4, CDKN2A (p14ARF), CDKN2A (p16INK4a), CHEK2, CTNNA1, DICER1, EPCAM (Deletion/duplication testing only), GREM1 (promoter region deletion/duplication testing only), KIT, MEN1, MLH1, MSH2, MSH3, MSH6, MUTYH, NBN, NF1, NTHL1, PALB2, PDGFRA, PMS2, POLD1, POLE, PTEN, RAD50, RAD51C, RAD51D, RNF43, SDHB,  SDHC, SDHD, SMAD4, SMARCA4. STK11, TP53, TSC1, TSC2, and VHL.  The following genes are evaluated for sequence changes only: SDHA and HOXB13 c.251G>A variant only.   PLAN: After considering the risks, benefits, and limitations, Ms. Escandon provided informed consent to pursue genetic testing and the blood sample was sent to [facility] for analysis of the Breast Cancer STAT panel + Common Hereditary Cancers panel. Results should be available within approximately one-two weeks' time, at which point they will be disclosed by telephone to Ms. Vanacker, as will any additional recommendations warranted by these results. Ms. Nold will receive a summary of her genetic counseling visit and a copy of her results once available. This information will also be available in Epic.   Ms. Goynes's questions were answered to her satisfaction today. Our contact information was provided should additional questions or concerns arise. Thank you for the referral and allowing [place] to share in the care of your patient.   Clint Guy, [place], [facility] Licensed, Certified Dispensing [email or web address] Phone: [phone]  The patient was seen for a total of 35 minutes in face-to-face genetic counseling.  This patient was discussed with Drs. Magrinat, Lindi Adie and/or Burr Medico who agrees with the above.    _______________________________________________________________________ For Office Staff:  Number of people involved in session: 1 Was an Intern/ student involved with case: no

## 2020-05-14 ENCOUNTER — Other Ambulatory Visit: Payer: Self-pay | Admitting: Surgery

## 2020-05-14 DIAGNOSIS — N6092 Unspecified benign mammary dysplasia of left breast: Secondary | ICD-10-CM

## 2020-05-18 DIAGNOSIS — Z1379 Encounter for other screening for genetic and chromosomal anomalies: Secondary | ICD-10-CM | POA: Insufficient documentation

## 2020-05-20 ENCOUNTER — Telehealth: Payer: Self-pay | Admitting: Genetic Counselor

## 2020-05-20 ENCOUNTER — Ambulatory Visit: Payer: Self-pay | Admitting: Genetic Counselor

## 2020-05-20 ENCOUNTER — Encounter: Payer: Self-pay | Admitting: Genetic Counselor

## 2020-05-20 DIAGNOSIS — Z1379 Encounter for other screening for genetic and chromosomal anomalies: Secondary | ICD-10-CM

## 2020-05-20 NOTE — Progress Notes (Signed)
HPI:  [name] was previously seen in the [facility] clinic due to a family history of cancer and concerns regarding a hereditary predisposition to cancer. Please refer to our prior cancer genetics clinic note for more information regarding our discussion, assessment and recommendations, at the time. [name] recent genetic test results were disclosed to her, as were recommendations warranted by these results. These results and recommendations are discussed in more detail below.  FAMILY HISTORY:  We obtained a detailed, 4-generation family history.  Significant diagnoses are listed below: Family History  Problem Relation Age of Onset  . Breast cancer Mother 41  . Cancer Mother 65       oral  . Throat cancer Maternal Grandfather 33  . Lung cancer Maternal Grandfather 11  . Stroke Paternal Grandmother        multiple  . Heart attack Paternal Grandfather   . Factor V Leiden deficiency Cousin        paternal first cousins   [name] has two daughters (ages 72 and 14) and one son (age 67). She has one sister (age 24). None of these family members have had cancer.  [name]'s mother died at the age of 68 from breast cancer and oral cancer. Her mother did not have any siblings. Her maternal grandmother died at the age of 20. Her maternal grandfather died at the age of 17 from throat cancer and lung cancer.   [name]'s father is 14 and has not had cancer. She has one paternal aunt. Her paternal grandmother died older than 20 and had a history of multiple strokes. Her paternal grandfather died older than 72 from a heart attack.  [name] is unaware of previous family history of genetic testing for hereditary cancer risks. Patient's maternal ancestors are of [place] descent, and paternal ancestors are of [place] and [place] descent. There is no reported Ashkenazi Jewish ancestry. There is no known consanguinity.  GENETIC TEST RESULTS: Genetic  testing reported out on 05/18/2020 through the [facility] Breast Cancer STAT panel + Common Hereditary Cancers panel. No pathogenic variants were detected.   The Breast Cancer STAT Panel offered by [facility] includes sequencing and deletion/duplication analysis for the following 9 genes:  ATM, BRCA1, BRCA2, CDH1, CHEK2, PALB2, PTEN, STK11 and TP53. The Common Hereditary Cancers Panel offered by [facility] includes sequencing and/or deletion duplication testing of the following 48 genes: APC, ATM, AXIN2, BARD1, BMPR1A, BRCA1, BRCA2, BRIP1, CDH1, CDK4, CDKN2A (p14ARF), CDKN2A (p16INK4a), CHEK2, CTNNA1, DICER1, EPCAM (Deletion/duplication testing only), GREM1 (promoter region deletion/duplication testing only), KIT, MEN1, MLH1, MSH2, MSH3, MSH6, MUTYH, NBN, NF1, NTHL1, PALB2, PDGFRA, PMS2, POLD1, POLE, PTEN, RAD50, RAD51C, RAD51D, RNF43, SDHB, SDHC, SDHD, SMAD4, SMARCA4. STK11, TP53, TSC1, TSC2, and VHL.  The following genes were evaluated for sequence changes only: SDHA and HOXB13 c.251G>A variant only. The test report will be scanned into EPIC and located under the Molecular Pathology section of the Results Review tab.  A portion of the result report is included below for reference.     We discussed with [name] that because current genetic testing is not perfect, it is possible there may be a gene mutation in one of these genes that current testing cannot detect, but that chance is small.  We also discussed that there could be another gene that has not yet been discovered, or that we have not yet tested, that is responsible for the cancer diagnoses in the family. It is also possible there is  a hereditary cause for the cancer in the family that [name] did not inherit and therefore was not identified in her testing.  Therefore, it is important to remain in touch with cancer genetics in the future so that we can continue to offer [name] the most up to date genetic testing.   CANCER SCREENING  RECOMMENDATIONS: [name] test result is considered negative (normal).  This means that we have not identified a hereditary cause for her family history of cancer at this time. While reassuring, this does not definitively rule out a hereditary predisposition to cancer. It is still possible that there could be genetic mutations that are undetectable by current technology. There could be genetic mutations in genes that have not been tested or identified to increase cancer risk.  Therefore, it is recommended she continue to follow the cancer management and screening guidelines provided by her primary healthcare provider.   An individual's cancer risk and medical management are not determined by genetic test results alone. Overall cancer risk assessment incorporates additional factors, including personal medical history, family history, and any available genetic information that may result in a personalized plan for cancer prevention and surveillance.  Because of her history of atypical ductal hyperplasia, the Tyrer-Cuzick statistical breast cancer risk model is not validated to estimate [name]'s lifetime risk to develop breast cancer. However, it has been observed that individuals with a history of atypical ductal hyperplasia have an increased risk to develop breast cancer, up to 30% within 25 years (PMID: 28003491). The ACS recommends consideration of breast MRI screening as an adjunct to mammography for patients at high risk (defined as 20% or greater lifetime risk).  [name] may be considered to be at high risk for breast cancer due to her personal history of atypical ductal hyperplasia. Therefore, we recommend that she consider annual breast MRIs in addition to annual mammograms. We discussed that [name] should discuss her individual situation with her referring physician and determine a breast cancer screening plan with which they are both comfortable.   RECOMMENDATIONS FOR FAMILY  MEMBERS:  Individuals in this family might be at some increased risk of developing cancer, over the general population risk, simply due to the family history of cancer.  We recommended women in this family have a yearly mammogram beginning at age 69, or 35 years younger than the earliest onset of cancer, an annual clinical breast exam, and perform monthly breast self-exams. Women in this family should also have a gynecological exam as recommended by their primary provider. All family members should be referred for colonoscopy starting at age 73.  FOLLOW-UP: Lastly, we discussed with [name] that cancer genetics is a rapidly advancing field and it is possible that new genetic tests will be appropriate for her and/or her family members in the future. We encouraged her to remain in contact with cancer genetics on an annual basis so we can update her personal and family histories and let her know of advances in cancer genetics that may benefit this family.   Our contact number was provided. [name]'s questions were answered to her satisfaction, and she knows she is welcome to call us at anytime with additional questions or concerns.   Clint Guy, MS, [facility] Genetic Counselor [email or web address] Phone: [phone]

## 2020-05-20 NOTE — Telephone Encounter (Signed)
Revealed negative genetic testing. Discussed that we do not know why there is cancer in the family. She would be considered to be at an elevated risk for breast cancer based on her personal history of atypical ductal hyperplasia and family history of breast cancer, although we would expect her risk for other types of cancers to be the same as they are in the general population.

## 2020-05-21 DIAGNOSIS — M1612 Unilateral primary osteoarthritis, left hip: Secondary | ICD-10-CM | POA: Diagnosis not present

## 2020-05-21 DIAGNOSIS — M1611 Unilateral primary osteoarthritis, right hip: Secondary | ICD-10-CM | POA: Diagnosis not present

## 2020-05-27 ENCOUNTER — Other Ambulatory Visit: Payer: BC Managed Care – PPO

## 2020-05-27 ENCOUNTER — Encounter: Payer: BC Managed Care – PPO | Admitting: Genetic Counselor

## 2020-05-29 ENCOUNTER — Ambulatory Visit (INDEPENDENT_AMBULATORY_CARE_PROVIDER_SITE_OTHER): Payer: BC Managed Care – PPO | Admitting: Orthopaedic Surgery

## 2020-05-29 ENCOUNTER — Other Ambulatory Visit: Payer: Self-pay

## 2020-05-29 ENCOUNTER — Encounter: Payer: Self-pay | Admitting: Orthopaedic Surgery

## 2020-05-29 DIAGNOSIS — M25552 Pain in left hip: Secondary | ICD-10-CM

## 2020-05-29 NOTE — Progress Notes (Signed)
Office Visit Note   Patient: Carrie Vang           Date of Birth: 1966/01/03           MRN: [id number] Visit Date: 05/29/2020              Requested by: Merri Brunette, MD [phone] [place],  [place] [address] PCP: Merri Brunette, MD   Assessment & Plan: Visit Diagnoses:  1. Pain in left hip     Plan: In my opinion she does need a MRI of her left hip to assess the tendinous structures around the greater trochanteric area and even the hamstring origin based on where she is hurting.  I would also look at the cartilage around her hip in general.  She will avoid activities with Pilates and other stretching that is causing her to have this pain.  This is been going on for years now and I do feel at this point a MRI could help guide therapy as well as what exercise she she should avoid and could hopefully help guide where a potential injection could be tried.  All questions and concerns were answered and addressed.  She agrees with this treatment plan.  She knows to call for follow-up appointment when she has a date for her MRI.  Follow-Up Instructions: No follow-ups on file.   Orders:  No orders of the defined types were placed in this encounter.  No orders of the defined types were placed in this encounter.     Procedures: No procedures performed   Clinical Data: No additional findings.   Subjective: Chief Complaint  Patient presents with  . Right Hip - Pain  . Left Hip - Pain  The patient comes in today as a second opinion as relates to left hip pain.  She states that the pain will be in both hips.  She is only 54 years old very petite and thin.  She is very active and does participate in Pilates regularly.  She points to her left hip over the ischium area and the trochanteric area as a source of her pain with really no groin pain.  She does have x-rays that accompany her.  She had seen one of my colleagues in town who felt that she would likely  need a hip replacement as things worsen with the right hip.  I have those x-rays for my review.  She does not need an assistive device to walk.  The pain does not wake her up at night but it is activity related.  She is not a diabetic and not a smoker and has no acute medical issues other than she is having a breast biopsy in the near future by actually my brother.  HPI  Review of Systems She currently denies any headache, chest pain, shortness of breath, fever, chills, nausea, vomiting  Objective: Vital Signs: There were no vitals taken for this visit.  Physical Exam She is alert and orient x3 and in no acute distress Ortho Exam On examination she does not walk with a limp.  Her leg lengths are equal.  She has pretty good range of motion of both hips but they are slightly stiff with rotation.  When I have her lay in a decubitus position with her left hip up there is pain over the trochanteric area but also the ischium.  It does radiate down the IT band.  She has negative straight leg raise bilaterally and excellent strength  in the bilateral lower extremities with normal sensation in all dermatomes and normal reflexes. Specialty Comments:  No specialty comments available.  Imaging: No results found. X-rays that accompany her do show both hips.  Both femoral heads are deep into the pelvis almost as if there is a slight protrusio but this really only slight.  The superior lateral joint space on both sides is maintained.  There is some inferior osteophytes along the inferior acetabular ridge.  PMFS History: Patient Active Problem List   Diagnosis Date Noted  . Genetic testing 05/18/2020  . Family history of breast cancer   . Family history of oral cancer   . Family history of throat cancer   . Family history of lung cancer    Past Medical History:  Diagnosis Date  . Family history of breast cancer   . Family history of lung cancer   . Family history of oral cancer   . Family history  of throat cancer     Family History  Problem Relation Age of Onset  . Breast cancer Mother 36  . Cancer Mother 54       oral  . Throat cancer Maternal Grandfather 90  . Lung cancer Maternal Grandfather 67  . Stroke Paternal Grandmother        multiple  . Heart attack Paternal Grandfather   . Factor V Leiden deficiency Cousin        paternal first cousins    History reviewed. No pertinent surgical history. Social History   Occupational History  . Not on file  Tobacco Use  . Smoking status: Not on file  Substance and Sexual Activity  . Alcohol use: Not on file  . Drug use: Not on file  . Sexual activity: Not on file

## 2020-06-05 ENCOUNTER — Other Ambulatory Visit: Payer: Self-pay

## 2020-06-05 ENCOUNTER — Encounter (HOSPITAL_BASED_OUTPATIENT_CLINIC_OR_DEPARTMENT_OTHER): Payer: Self-pay | Admitting: Surgery

## 2020-06-08 ENCOUNTER — Inpatient Hospital Stay (HOSPITAL_COMMUNITY): Admission: RE | Admit: 2020-06-08 | Payer: BC Managed Care – PPO | Source: Ambulatory Visit

## 2020-06-10 ENCOUNTER — Other Ambulatory Visit (HOSPITAL_COMMUNITY)
Admission: RE | Admit: 2020-06-10 | Discharge: 2020-06-10 | Disposition: A | Discharge status: Home / Self Care | Payer: BC Managed Care – PPO | Source: Ambulatory Visit | Attending: Surgery | Admitting: Surgery

## 2020-06-10 DIAGNOSIS — Z20822 Contact with and (suspected) exposure to covid-19: Secondary | ICD-10-CM | POA: Insufficient documentation

## 2020-06-10 DIAGNOSIS — Z8349 Family history of other endocrine, nutritional and metabolic diseases: Secondary | ICD-10-CM | POA: Diagnosis not present

## 2020-06-10 DIAGNOSIS — Z811 Family history of alcohol abuse and dependence: Secondary | ICD-10-CM | POA: Diagnosis not present

## 2020-06-10 DIAGNOSIS — Z820 Family history of epilepsy and other diseases of the nervous system: Secondary | ICD-10-CM | POA: Diagnosis not present

## 2020-06-10 DIAGNOSIS — Z8261 Family history of arthritis: Secondary | ICD-10-CM | POA: Diagnosis not present

## 2020-06-10 DIAGNOSIS — Z818 Family history of other mental and behavioral disorders: Secondary | ICD-10-CM | POA: Diagnosis not present

## 2020-06-10 DIAGNOSIS — Z803 Family history of malignant neoplasm of breast: Secondary | ICD-10-CM | POA: Diagnosis not present

## 2020-06-10 DIAGNOSIS — Z79899 Other long term (current) drug therapy: Secondary | ICD-10-CM | POA: Diagnosis not present

## 2020-06-10 DIAGNOSIS — Z01812 Encounter for preprocedural laboratory examination: Secondary | ICD-10-CM | POA: Insufficient documentation

## 2020-06-10 DIAGNOSIS — Z832 Family history of diseases of the blood and blood-forming organs and certain disorders involving the immune mechanism: Secondary | ICD-10-CM | POA: Diagnosis not present

## 2020-06-10 DIAGNOSIS — Z833 Family history of diabetes mellitus: Secondary | ICD-10-CM | POA: Diagnosis not present

## 2020-06-10 DIAGNOSIS — N6092 Unspecified benign mammary dysplasia of left breast: Secondary | ICD-10-CM | POA: Diagnosis not present

## 2020-06-10 LAB — SARS CORONAVIRUS 2 (TAT 6-24 HRS): SARS Coronavirus 2: NEGATIVE

## 2020-06-10 MED ORDER — ENSURE PRE-SURGERY PO LIQD: 296.0000 mL | Freq: Once | ORAL | Status: DC

## 2020-06-10 NOTE — Progress Notes (Signed)

## 2020-06-11 ENCOUNTER — Ambulatory Visit
Admission: RE | Admit: 2020-06-11 | Discharge: 2020-06-11 | Disposition: A | Discharge status: Home / Self Care | Payer: BC Managed Care – PPO | Source: Ambulatory Visit | Attending: Surgery | Admitting: Surgery

## 2020-06-11 ENCOUNTER — Other Ambulatory Visit: Payer: Self-pay

## 2020-06-11 ENCOUNTER — Other Ambulatory Visit: Payer: Self-pay | Admitting: Surgery

## 2020-06-11 DIAGNOSIS — N6092 Unspecified benign mammary dysplasia of left breast: Secondary | ICD-10-CM

## 2020-06-11 DIAGNOSIS — R921 Mammographic calcification found on diagnostic imaging of breast: Secondary | ICD-10-CM | POA: Diagnosis not present

## 2020-06-11 NOTE — H&P (Signed)
Carrie Vang  Location: [facility] Patient #: [id number] DOB: Nov 24, 1965 Married / Language: English / Race: White Female   History of Present Illness The patient is a 54 year old female who presents with a complaint of Breast problems.  Chief complaint: Atypical ductal hyperplasia of the left breast  This is a 54 year old female referred by the breast center for evaluation of atypical ductal hyperplasia of the left breast. She has over a mammography when abnormal calcifications were seen in the upper outer quadrant left breast. This area spanned approximately 2.7 cm. She underwent a stereotactic biopsy showing atypical ductal hyperplasia. There is no evidence of malignancy. She has a family history of mother having had breast cancer in her 46s. She has what sounds like metastatic cancer from another source at the same time. The patient herself has had no issues regarding her breasts. She has had no previous abdominal maladies of the breast. She denies nipple discharge. She is otherwise healthy without complaints.   Past Surgical History (Tanisha A. Manson Passey, RMA; Breast Biopsy  Left. Oral Surgery   Diagnostic Studies History (Tanisha A. Manson Passey, RMA; Colonoscopy  1-5 years ago Mammogram  within last year Pap Smear  1-5 years ago  Allergies (Tanisha A. Manson Passey, RMA; No Known Drug Allergies  Allergies Reconciled   Medication History (Tanisha A. Manson Passey, RMA;  Levothyroxine Sodium ( Tablet, Oral) Active. Progesterone Micronized (100MG  Capsule, Oral) Active. Medications Reconciled  Social History (Tanisha A. , RM Alcohol use  Occasional alcohol use. Caffeine use  Carbonated beverages, Coffee. No drug use  Tobacco use  Never smoker.  Family History (Tanisha A. Manson Passey, RMA;  Alcohol Abuse  Mother. Arthritis  Father. Bleeding disorder  Family Members In General. Breast Cancer  Mother. Depression  Daughter. Diabetes Mellitus   Son. Migraine Headache  Daughter. Thyroid problems  Family Members In General.  Pregnancy / Birth History (Tanisha A. Manson Passey, RMA; Age at menarche  12 years. Age of menopause  51-55 Contraceptive History  Intrauterine device, Oral contraceptives. Gravida  3 Irregular periods  Length (months) of breastfeeding  7-12 Maternal age  67-30 Para  3  Other Problems (Tanisha A. 38-31, RMA; Gastroesophageal Reflux Disease  Other disease, cancer, significant illness     Review of Systems (Tanisha A. Manson Passey RMA;  General Not Present- Appetite Loss, Chills, Fatigue, Fever, Night Sweats, Weight Gain and Weight Loss. Skin Present- Non-Healing Wounds. Not Present- Change in Wart/Mole, Dryness, Hives, Jaundice, New Lesions, Rash and Ulcer. HEENT Present- Seasonal Allergies. Not Present- Earache, Hearing Loss, Hoarseness, Nose Bleed, Oral Ulcers, Ringing in the Ears, Sinus Pain, Sore Throat, Visual Disturbances, Wears glasses/contact lenses and Yellow Eyes. Respiratory Not Present- Bloody sputum, Chronic Cough, Difficulty Breathing, Snoring and Wheezing. Breast Present- Breast Mass. Not Present- Breast Pain, Nipple Discharge and Skin Changes. Cardiovascular Not Present- Chest Pain, Difficulty Breathing Lying Down, Leg Cramps, Palpitations, Rapid Heart Rate, Shortness of Breath and Swelling of Extremities. Gastrointestinal Not Present- Abdominal Pain, Bloating, Bloody Stool, Change in Bowel Habits, Chronic diarrhea, Constipation, Difficulty Swallowing, Excessive gas, Gets full quickly at meals, Hemorrhoids, Indigestion, Nausea, Rectal Pain and Vomiting. Female Genitourinary Not Present- Frequency, Nocturia, Painful Urination, Pelvic Pain and Urgency. Musculoskeletal Not Present- Back Pain, Joint Pain, Joint Stiffness, Muscle Pain, Muscle Weakness and Swelling of Extremities. Neurological Not Present- Decreased Memory, Fainting, Headaches, Numbness, Seizures, Tingling, Tremor, Trouble walking and  Weakness. Psychiatric Not Present- Anxiety, Bipolar, Change in Sleep Pattern, Depression, Fearful and Frequent crying. Endocrine Present- Hot flashes. Not Present- Cold Intolerance,  Excessive Hunger, Hair Changes, Heat Intolerance and New Diabetes. Hematology Not Present- Blood Thinners, Easy Bruising, Excessive bleeding, Gland problems, HIV and Persistent Infections.  Vitals  Weight: 135.6 lb Height: 66in Body Surface Area: 1.7 m Body Mass Index: 21.89 kg/m  Temp.: 98.64F  Pulse: 90 (Regular)  BP: 124/82(Sitting, Left Arm, Standard)   Physical Exam  The physical exam findings are as follows: Note: She appears well on examination  Breasts were normal in appearance. There is no palpable left breast mass. There is minimal ecchymosis on the left breast. There is no axillary adenopathy. The nipple areolar complex appears normal.    Assessment & Plan   ATYPICAL DUCTAL HYPERPLASIA OF LEFT BREAST (N60.92)  Impression: I reviewed the patient's notes in the electronic medical records. I reviewed her mammogram and pathology results. I gave her a copy of her pathology results. She has atypical ductal hyperplasia of the left breast. I do not see malignancy. We discussed the fact this is a high risk breast lesion for developing further breast cancer in the future. Given this and her family history as well as the size of the area of calcifications, a radioactive seed divided left breast lumpectomy is recommended to completely remove this area to rule out malignancy. I discussed the surgical procedure in detail. I discussed the risks which includes but is not limited to bleeding, infection, injury to surrounding structures, the need for further procedures if malignancy is found, cardiopulmonary issues, postoperative recovery, etc. She understands and wished to proceed with surgery which will be scheduled.

## 2020-06-12 ENCOUNTER — Ambulatory Visit (HOSPITAL_BASED_OUTPATIENT_CLINIC_OR_DEPARTMENT_OTHER): Payer: BC Managed Care – PPO | Admitting: Certified Registered Nurse Anesthetist

## 2020-06-12 ENCOUNTER — Ambulatory Visit (HOSPITAL_BASED_OUTPATIENT_CLINIC_OR_DEPARTMENT_OTHER)
Admission: RE | Admit: 2020-06-12 | Discharge: 2020-06-12 | Disposition: A | Discharge status: Home / Self Care | Payer: BC Managed Care – PPO | Attending: Surgery | Admitting: Surgery

## 2020-06-12 ENCOUNTER — Encounter (HOSPITAL_BASED_OUTPATIENT_CLINIC_OR_DEPARTMENT_OTHER)
Admission: RE | Disposition: A | Discharge status: Home / Self Care | Payer: Self-pay | Source: Home / Self Care | Attending: Surgery

## 2020-06-12 ENCOUNTER — Ambulatory Visit
Admission: RE | Admit: 2020-06-12 | Discharge: 2020-06-12 | Disposition: A | Discharge status: Home / Self Care | Payer: BC Managed Care – PPO | Source: Ambulatory Visit | Attending: Surgery | Admitting: Surgery

## 2020-06-12 ENCOUNTER — Other Ambulatory Visit: Payer: Self-pay

## 2020-06-12 ENCOUNTER — Encounter (HOSPITAL_BASED_OUTPATIENT_CLINIC_OR_DEPARTMENT_OTHER): Payer: Self-pay | Admitting: Surgery

## 2020-06-12 DIAGNOSIS — Z8261 Family history of arthritis: Secondary | ICD-10-CM | POA: Diagnosis not present

## 2020-06-12 DIAGNOSIS — N6082 Other benign mammary dysplasias of left breast: Secondary | ICD-10-CM | POA: Diagnosis not present

## 2020-06-12 DIAGNOSIS — Z20822 Contact with and (suspected) exposure to covid-19: Secondary | ICD-10-CM | POA: Insufficient documentation

## 2020-06-12 DIAGNOSIS — Z811 Family history of alcohol abuse and dependence: Secondary | ICD-10-CM | POA: Insufficient documentation

## 2020-06-12 DIAGNOSIS — Z8349 Family history of other endocrine, nutritional and metabolic diseases: Secondary | ICD-10-CM | POA: Insufficient documentation

## 2020-06-12 DIAGNOSIS — N6092 Unspecified benign mammary dysplasia of left breast: Secondary | ICD-10-CM

## 2020-06-12 DIAGNOSIS — Z79899 Other long term (current) drug therapy: Secondary | ICD-10-CM | POA: Diagnosis not present

## 2020-06-12 DIAGNOSIS — Z818 Family history of other mental and behavioral disorders: Secondary | ICD-10-CM | POA: Insufficient documentation

## 2020-06-12 DIAGNOSIS — Z832 Family history of diseases of the blood and blood-forming organs and certain disorders involving the immune mechanism: Secondary | ICD-10-CM | POA: Insufficient documentation

## 2020-06-12 DIAGNOSIS — Z803 Family history of malignant neoplasm of breast: Secondary | ICD-10-CM | POA: Diagnosis not present

## 2020-06-12 DIAGNOSIS — Z833 Family history of diabetes mellitus: Secondary | ICD-10-CM | POA: Insufficient documentation

## 2020-06-12 DIAGNOSIS — R921 Mammographic calcification found on diagnostic imaging of breast: Secondary | ICD-10-CM | POA: Diagnosis not present

## 2020-06-12 DIAGNOSIS — E039 Hypothyroidism, unspecified: Secondary | ICD-10-CM | POA: Diagnosis not present

## 2020-06-12 DIAGNOSIS — Z820 Family history of epilepsy and other diseases of the nervous system: Secondary | ICD-10-CM | POA: Diagnosis not present

## 2020-06-12 DIAGNOSIS — Z801 Family history of malignant neoplasm of trachea, bronchus and lung: Secondary | ICD-10-CM | POA: Diagnosis not present

## 2020-06-12 HISTORY — PX: BREAST LUMPECTOMY WITH RADIOACTIVE SEED LOCALIZATION: SHX6424

## 2020-06-12 HISTORY — DX: Hypothyroidism, unspecified: E03.9

## 2020-06-12 HISTORY — DX: Chest pain, unspecified: R07.9

## 2020-06-12 SURGERY — BREAST LUMPECTOMY WITH RADIOACTIVE SEED LOCALIZATION
Anesthesia: General | Site: Breast | Laterality: Left

## 2020-06-12 MED ORDER — GLYCOPYRROLATE PF 0.2 MG/ML IJ SOSY
PREFILLED_SYRINGE | INTRAMUSCULAR | Status: AC
  Filled 2020-06-12: qty 1

## 2020-06-12 MED ORDER — ONDANSETRON HCL 4 MG/2ML IJ SOLN
INTRAMUSCULAR | Status: AC
  Filled 2020-06-12: qty 2

## 2020-06-12 MED ORDER — BUPIVACAINE-EPINEPHRINE 0.5% -1:200000 IJ SOLN
INTRAMUSCULAR | Status: DC | PRN
  Administered 2020-06-12: 15 mg

## 2020-06-12 MED ORDER — CELECOXIB 200 MG PO CAPS
400.0000 mg | ORAL_CAPSULE | ORAL | Status: AC
  Administered 2020-06-12: 400 mg via ORAL

## 2020-06-12 MED ORDER — ACETAMINOPHEN 500 MG PO TABS
ORAL_TABLET | ORAL | Status: AC
  Filled 2020-06-12: qty 2

## 2020-06-12 MED ORDER — PROPOFOL 10 MG/ML IV BOLUS
INTRAVENOUS | Status: AC
  Filled 2020-06-12: qty 20

## 2020-06-12 MED ORDER — BUPIVACAINE-EPINEPHRINE (PF) 0.5% -1:200000 IJ SOLN
INTRAMUSCULAR | Status: AC
  Filled 2020-06-12: qty 30

## 2020-06-12 MED ORDER — CELECOXIB 200 MG PO CAPS
ORAL_CAPSULE | ORAL | Status: AC
  Filled 2020-06-12: qty 2

## 2020-06-12 MED ORDER — TRAMADOL HCL 50 MG PO TABS
50.0000 mg | ORAL_TABLET | Freq: Four times a day (QID) | ORAL | 0 refills | Status: DC | PRN
Start: 2020-06-12 — End: 2023-01-26

## 2020-06-12 MED ORDER — LIDOCAINE HCL (CARDIAC) PF 100 MG/5ML IV SOSY
PREFILLED_SYRINGE | INTRAVENOUS | Status: DC | PRN
  Administered 2020-06-12: 60 mg via INTRATRACHEAL

## 2020-06-12 MED ORDER — CEFAZOLIN SODIUM-DEXTROSE 2-4 GM/100ML-% IV SOLN
2.0000 g | INTRAVENOUS | Status: AC
  Administered 2020-06-12: 2 g via INTRAVENOUS

## 2020-06-12 MED ORDER — GABAPENTIN 300 MG PO CAPS
ORAL_CAPSULE | ORAL | Status: AC
  Filled 2020-06-12: qty 1

## 2020-06-12 MED ORDER — ACETAMINOPHEN 500 MG PO TABS
1000.0000 mg | ORAL_TABLET | Freq: Once | ORAL | Status: AC
  Administered 2020-06-12: 1000 mg via ORAL

## 2020-06-12 MED ORDER — FENTANYL CITRATE (PF) 100 MCG/2ML IJ SOLN
INTRAMUSCULAR | Status: DC | PRN
Start: 2020-06-12 — End: 2020-06-12
  Administered 2020-06-12 (×2): 50 ug via INTRAVENOUS

## 2020-06-12 MED ORDER — BUPIVACAINE HCL (PF) 0.5 % IJ SOLN
INTRAMUSCULAR | Status: AC
  Filled 2020-06-12: qty 30

## 2020-06-12 MED ORDER — FENTANYL CITRATE (PF) 100 MCG/2ML IJ SOLN
INTRAMUSCULAR | Status: AC
  Filled 2020-06-12: qty 2

## 2020-06-12 MED ORDER — GLYCOPYRROLATE 0.2 MG/ML IJ SOLN
INTRAMUSCULAR | Status: DC | PRN
  Administered 2020-06-12: .2 mg via INTRAVENOUS

## 2020-06-12 MED ORDER — ACETAMINOPHEN 500 MG PO TABS: 1000.0000 mg | ORAL_TABLET | ORAL | Status: AC

## 2020-06-12 MED ORDER — CHLORHEXIDINE GLUCONATE CLOTH 2 % EX PADS: 6.0000 | MEDICATED_PAD | Freq: Once | CUTANEOUS | Status: DC

## 2020-06-12 MED ORDER — PROPOFOL 10 MG/ML IV BOLUS
INTRAVENOUS | Status: DC | PRN
  Administered 2020-06-12: 120 mg via INTRAVENOUS
  Administered 2020-06-12: 80 mg via INTRAVENOUS

## 2020-06-12 MED ORDER — BUPIVACAINE HCL (PF) 0.25 % IJ SOLN
INTRAMUSCULAR | Status: AC
  Filled 2020-06-12: qty 30

## 2020-06-12 MED ORDER — CELECOXIB 200 MG PO CAPS: 200.0000 mg | ORAL_CAPSULE | Freq: Once | ORAL | Status: AC

## 2020-06-12 MED ORDER — HYDROMORPHONE HCL 1 MG/ML IJ SOLN: 0.2500 mg | INTRAMUSCULAR | Status: DC | PRN

## 2020-06-12 MED ORDER — CEFAZOLIN SODIUM-DEXTROSE 2-4 GM/100ML-% IV SOLN
INTRAVENOUS | Status: AC
  Filled 2020-06-12: qty 100

## 2020-06-12 MED ORDER — GLYCOPYRROLATE 0.2 MG/ML IJ SOLN
INTRAMUSCULAR | Status: AC
  Filled 2020-06-12: qty 3

## 2020-06-12 MED ORDER — ONDANSETRON HCL 4 MG/2ML IJ SOLN
INTRAMUSCULAR | Status: DC | PRN
  Administered 2020-06-12: 4 mg via INTRAVENOUS

## 2020-06-12 MED ORDER — MIDAZOLAM HCL 2 MG/2ML IJ SOLN
INTRAMUSCULAR | Status: AC
  Filled 2020-06-12: qty 2

## 2020-06-12 MED ORDER — MIDAZOLAM HCL 2 MG/2ML IJ SOLN
INTRAMUSCULAR | Status: DC | PRN
  Administered 2020-06-12: 2 mg via INTRAVENOUS

## 2020-06-12 MED ORDER — EPHEDRINE SULFATE 50 MG/ML IJ SOLN
INTRAMUSCULAR | Status: DC | PRN
  Administered 2020-06-12 (×2): 10 mg via INTRAVENOUS

## 2020-06-12 MED ORDER — GABAPENTIN 300 MG PO CAPS
300.0000 mg | ORAL_CAPSULE | ORAL | Status: AC
  Administered 2020-06-12: 300 mg via ORAL

## 2020-06-12 MED ORDER — LACTATED RINGERS IV SOLN: INTRAVENOUS | Status: DC

## 2020-06-12 MED ORDER — LIDOCAINE 2% (20 MG/ML) 5 ML SYRINGE
INTRAMUSCULAR | Status: AC
  Filled 2020-06-12: qty 5

## 2020-06-12 MED ORDER — DEXAMETHASONE SODIUM PHOSPHATE 10 MG/ML IJ SOLN
INTRAMUSCULAR | Status: DC | PRN
  Administered 2020-06-12: 5 mg via INTRAVENOUS

## 2020-06-12 MED ORDER — DEXAMETHASONE SODIUM PHOSPHATE 10 MG/ML IJ SOLN
INTRAMUSCULAR | Status: AC
  Filled 2020-06-12: qty 1

## 2020-06-12 MED ORDER — EPHEDRINE 5 MG/ML INJ
INTRAVENOUS | Status: AC
  Filled 2020-06-12: qty 10

## 2020-06-12 SURGICAL SUPPLY — 49 items
APPLIER CLIP 9.375 MED OPEN (MISCELLANEOUS)
BINDER BREAST 3XL (GAUZE/BANDAGES/DRESSINGS) IMPLANT
BINDER BREAST LRG (GAUZE/BANDAGES/DRESSINGS) IMPLANT
BINDER BREAST MEDIUM (GAUZE/BANDAGES/DRESSINGS) IMPLANT
BINDER BREAST XLRG (GAUZE/BANDAGES/DRESSINGS) IMPLANT
BINDER BREAST XXLRG (GAUZE/BANDAGES/DRESSINGS) IMPLANT
BLADE SURG 15 STRL LF DISP TIS (BLADE) ×1 IMPLANT
BLADE SURG 15 STRL SS (BLADE) ×3
CANISTER SUC SOCK COL 7IN (MISCELLANEOUS) IMPLANT
CANISTER SUCT 1200ML W/VALVE (MISCELLANEOUS) IMPLANT
CHLORAPREP W/TINT 26 (MISCELLANEOUS) ×3 IMPLANT
CLIP APPLIE 9.375 MED OPEN (MISCELLANEOUS) IMPLANT
COVER BACK TABLE 60X90IN (DRAPES) ×3 IMPLANT
COVER MAYO STAND STRL (DRAPES) ×3 IMPLANT
COVER PROBE W GEL 5X96 (DRAPES) ×3 IMPLANT
COVER WAND RF STERILE (DRAPES) IMPLANT
DECANTER SPIKE VIAL GLASS SM (MISCELLANEOUS) IMPLANT
DERMABOND ADVANCED (GAUZE/BANDAGES/DRESSINGS) ×2
DERMABOND ADVANCED .7 DNX12 (GAUZE/BANDAGES/DRESSINGS) ×1 IMPLANT
DRAPE LAPAROSCOPIC ABDOMINAL (DRAPES) ×3 IMPLANT
DRAPE UTILITY XL STRL (DRAPES) ×3 IMPLANT
ELECT REM PT RETURN 9FT ADLT (ELECTROSURGICAL) ×3
ELECTRODE REM PT RTRN 9FT ADLT (ELECTROSURGICAL) ×1 IMPLANT
GAUZE SPONGE 4X4 12PLY STRL LF (GAUZE/BANDAGES/DRESSINGS) IMPLANT
GLOVE BIO SURGEON STRL SZ 6.5 (GLOVE) ×2 IMPLANT
GLOVE BIO SURGEONS STRL SZ 6.5 (GLOVE) ×1
GLOVE SURG LTX SZ7.5 (GLOVE) ×3 IMPLANT
GLOVE SURG UNDER POLY LF SZ7 (GLOVE) ×3 IMPLANT
GOWN STRL REUS W/ TWL LRG LVL3 (GOWN DISPOSABLE) ×1 IMPLANT
GOWN STRL REUS W/ TWL XL LVL3 (GOWN DISPOSABLE) ×1 IMPLANT
GOWN STRL REUS W/TWL LRG LVL3 (GOWN DISPOSABLE) ×3
GOWN STRL REUS W/TWL XL LVL3 (GOWN DISPOSABLE) ×3
KIT MARKER MARGIN INK (KITS) ×3 IMPLANT
NEEDLE HYPO 25X1 1.5 SAFETY (NEEDLE) ×3 IMPLANT
NS IRRIG 1000ML POUR BTL (IV SOLUTION) ×3 IMPLANT
PACK BASIN DAY SURGERY FS (CUSTOM PROCEDURE TRAY) ×3 IMPLANT
PENCIL SMOKE EVACUATOR (MISCELLANEOUS) ×3 IMPLANT
SLEEVE SCD COMPRESS KNEE MED (MISCELLANEOUS) ×3 IMPLANT
SPONGE LAP 4X18 RFD (DISPOSABLE) ×3 IMPLANT
SUT MNCRL AB 4-0 PS2 18 (SUTURE) ×3 IMPLANT
SUT SILK 2 0 SH (SUTURE) IMPLANT
SUT VIC AB 3-0 SH 27 (SUTURE) ×3
SUT VIC AB 3-0 SH 27X BRD (SUTURE) ×1 IMPLANT
SYR CONTROL 10ML LL (SYRINGE) ×3 IMPLANT
TOWEL GREEN STERILE FF (TOWEL DISPOSABLE) ×3 IMPLANT
TRAY FAXITRON CT DISP (TRAY / TRAY PROCEDURE) ×3 IMPLANT
TUBE CONNECTING 20'X1/4 (TUBING)
TUBE CONNECTING 20X1/4 (TUBING) IMPLANT
YANKAUER SUCT BULB TIP NO VENT (SUCTIONS) IMPLANT

## 2020-06-12 NOTE — Anesthesia Postprocedure Evaluation (Signed)
Anesthesia Post Note  Patient: Carrie Vang  Procedure(s) Performed: LEFT BREAST LUMPECTOMY WITH RADIOACTIVE SEED LOCALIZATION (Left Breast)     Patient location during evaluation: PACU Anesthesia Type: General Level of consciousness: awake and alert Pain management: pain level controlled Vital Signs Assessment: post-procedure vital signs reviewed and stable Respiratory status: spontaneous breathing, nonlabored ventilation and respiratory function stable Cardiovascular status: blood pressure returned to baseline and stable Postop Assessment: no apparent nausea or vomiting Anesthetic complications: no   No complications documented.  Last Vitals:  Vitals:   06/12/20 0930 06/12/20 0938  BP: 112/75 126/71  Pulse: 79 75  Resp: 14 16  Temp:  36.4 C  SpO2: 100% 99%    Last Pain:  Vitals:   06/12/20 1000  TempSrc:   PainSc: 0-No pain                 Glennon Kopko,W. EDMOND

## 2020-06-12 NOTE — Transfer of Care (Signed)
Immediate Anesthesia Transfer of Care Note  Patient: Carrie Vang  Procedure(s) Performed: LEFT BREAST LUMPECTOMY WITH RADIOACTIVE SEED LOCALIZATION (Left Breast)  Patient Location: PACU  Anesthesia Type:General  Level of Consciousness: awake, oriented and patient cooperative  Airway & Oxygen Therapy: Patient Spontanous Breathing and Patient connected to face mask oxygen  Post-op Assessment: Report given to RN, Post -op Vital signs reviewed and stable, Patient moving all extremities X 4 and Patient able to stick tongue midline  Post vital signs: Reviewed and stable  Last Vitals:  Vitals Value Taken Time  BP 114/72 06/12/20 0906  Temp    Pulse 105 06/12/20 0907  Resp 16 06/12/20 0907  SpO2 100 % 06/12/20 0907  Vitals shown include unvalidated device data.  Last Pain:  Vitals:   06/12/20 0722  TempSrc: Oral  PainSc: 0-No pain      Patients Stated Pain Goal: 1 (06/12/20 1884)  Complications: No complications documented.

## 2020-06-12 NOTE — Discharge Instructions (Signed)
[facility] Office Phone Number [phone]  BREAST BIOPSY/ PARTIAL MASTECTOMY: POST OP INSTRUCTIONS  Always review your discharge instruction sheet given to you by the facility where your surgery was performed.  IF YOU HAVE DISABILITY OR FAMILY LEAVE FORMS, YOU MUST BRING THEM TO THE OFFICE FOR PROCESSING.  DO NOT GIVE THEM TO YOUR DOCTOR.  1. A prescription for pain medication may be given to you upon discharge.  Take your pain medication as prescribed, if needed.  If narcotic pain medicine is not needed, then you may take acetaminophen (Tylenol) or ibuprofen (Advil) as needed. DO NOT TAKE TYLENOL OR IBUPROFEN BEFORE 1:30pm TODAY, IF NEEDED. 2. Take your usually prescribed medications unless otherwise directed 3. If you need a refill on your pain medication, please contact your pharmacy.  They will contact our office to request authorization.  Prescriptions will not be filled after 5pm or on week-ends. 4. You should eat very light the first 24 hours after surgery, such as soup, crackers, pudding, etc.  Resume your normal diet the day after surgery. 5. Most patients will experience some swelling and bruising in the breast.  Ice packs and a good support bra will help.  Swelling and bruising can take several days to resolve.  6. It is common to experience some constipation if taking pain medication after surgery.  Increasing fluid intake and taking a stool softener will usually help or prevent this problem from occurring.  A mild laxative (Milk of Magnesia or Miralax) should be taken according to package directions if there are no bowel movements after 48 hours. 7. Unless discharge instructions indicate otherwise, you may remove your bandages 24-48 hours after surgery, and you may shower at that time.  You may have steri-strips (small skin tapes) in place directly over the incision.  These strips should be left on the skin for 7-10 days.  If your surgeon used skin glue on the incision,  you may shower in 24 hours.  The glue will flake off over the next 2-3 weeks.  Any sutures or staples will be removed at the office during your follow-up visit. 8. ACTIVITIES:  You may resume regular daily activities (gradually increasing) beginning the next day.  Wearing a good support bra or sports bra minimizes pain and swelling.  You may have sexual intercourse when it is comfortable. a. You may drive when you no longer are taking prescription pain medication, you can comfortably wear a seatbelt, and you can safely maneuver your car and apply brakes. b. RETURN TO WORK:  ______________________________________________________________________________________ 9. You should see your doctor in the office for a follow-up appointment approximately two weeks after your surgery.  Your doctor's nurse will typically make your follow-up appointment when she calls you with your pathology report.  Expect your pathology report 2-3 business days after your surgery.  You may call to check if you do not hear from [place] after three days. 10. OTHER INSTRUCTIONS:YOU MAY SHOWER STARTING TOMORROW 11. ICE PACK, TYLENOL, AND IBUPROFEN ALSO FOR PAIN 12. NO VIGOROUS ACTIVITY FOR ONE WEEK _______________________________________________________________________________________________ _____________________________________________________________________________________________________________________________________ _____________________________________________________________________________________________________________________________________ _____________________________________________________________________________________________________________________________________  WHEN TO CALL YOUR DOCTOR: 1. Fever over 101.0 2. Nausea and/or vomiting. 3. Extreme swelling or bruising. 4. Continued bleeding from incision. 5. Increased pain, redness, or drainage from the incision.  The clinic staff is available to answer your  questions during regular business hours.  Please don't hesitate to call and ask to speak to one of the nurses for clinical concerns.  If you have a medical emergency,  go to the nearest emergency room or call 911.  A surgeon from [facility] is always on call at the hospital.  For further questions, please visit [email or web address]    Post Anesthesia Home Care Instructions  Activity: Get plenty of rest for the remainder of the day. A responsible individual must stay with you for 24 hours following the procedure.  For the next 24 hours, DO NOT: -Drive a car -Paediatric nurse -Drink alcoholic beverages -Take any medication unless instructed by your physician -Make any legal decisions or sign important papers.  Meals: Start with liquid foods such as gelatin or soup. Progress to regular foods as tolerated. Avoid greasy, spicy, heavy foods. If nausea and/or vomiting occur, drink only clear liquids until the nausea and/or vomiting subsides. Call your physician if vomiting continues.  Special Instructions/Symptoms: Your throat may feel dry or sore from the anesthesia or the breathing tube placed in your throat during surgery. If this causes discomfort, gargle with warm salt water. The discomfort should disappear within 24 hours.  If you had a scopolamine patch placed behind your ear for the management of post- operative nausea and/or vomiting:  1. The medication in the patch is effective for 72 hours, after which it should be removed.  Wrap patch in a tissue and discard in the trash. Wash hands thoroughly with soap and water. 2. You may remove the patch earlier than 72 hours if you experience unpleasant side effects which may include dry mouth, dizziness or visual disturbances. 3. Avoid touching the patch. Wash your hands with soap and water after contact with the patch.

## 2020-06-12 NOTE — Anesthesia Procedure Notes (Signed)
Procedure Name: LMA Insertion Date/Time: 06/12/2020 8:27 AM Performed by: Salomon Mast, CRNA Pre-anesthesia Checklist: Patient identified, Emergency Drugs available, Suction available and Patient being monitored Patient Re-evaluated:Patient Re-evaluated prior to induction Oxygen Delivery Method: Circle system utilized Preoxygenation: Pre-oxygenation with 100% oxygen Induction Type: IV induction Ventilation: Mask ventilation without difficulty LMA: LMA inserted LMA Size: 4.0 Number of attempts: 1 Placement Confirmation: positive ETCO2 and breath sounds checked- equal and bilateral Tube secured with: Tape Dental Injury: Teeth and Oropharynx as per pre-operative assessment

## 2020-06-12 NOTE — Anesthesia Preprocedure Evaluation (Addendum)
Anesthesia Evaluation  Patient identified by MRN, date of birth, ID band Patient awake    Reviewed: Allergy & Precautions, H&P , NPO status , Patient's Chart, lab work & pertinent test results  Airway Mallampati: I  TM Distance: >3 FB Neck ROM: Full    Dental no notable dental hx. (+) Teeth Intact, Dental Advisory Given   Pulmonary neg pulmonary ROS,    Pulmonary exam normal breath sounds clear to auscultation       Cardiovascular negative cardio ROS   Rhythm:Regular Rate:Normal     Neuro/Psych negative neurological ROS  negative psych ROS   GI/Hepatic negative GI ROS, Neg liver ROS,   Endo/Other  Hypothyroidism   Renal/GU negative Renal ROS  negative genitourinary   Musculoskeletal   Abdominal   Peds  Hematology negative hematology ROS (+)   Anesthesia Other Findings   Reproductive/Obstetrics negative OB ROS                            Anesthesia Physical Anesthesia Plan  ASA: II  Anesthesia Plan: General   Post-op Pain Management:    Induction: Intravenous  PONV Risk Score and Plan: 4 or greater and Ondansetron, Dexamethasone and Midazolam  Airway Management Planned: LMA  Additional Equipment:   Intra-op Plan:   Post-operative Plan: Extubation in OR  Informed Consent: I have reviewed the patients History and Physical, chart, labs and discussed the procedure including the risks, benefits and alternatives for the proposed anesthesia with the patient or authorized representative who has indicated his/her understanding and acceptance.     Dental advisory given  Plan Discussed with: CRNA  Anesthesia Plan Comments:         Anesthesia Quick Evaluation

## 2020-06-12 NOTE — Interval H&P Note (Signed)
History and Physical Interval Note:no change in H and P  06/12/2020 8:05 AM  Carrie Vang  has presented today for surgery, with the diagnosis of ATYPICAL DUCTAL HYPERPLASIA LEFT BREAST.  The various methods of treatment have been discussed with the patient and family. After consideration of risks, benefits and other options for treatment, the patient has consented to  Procedure(s) with comments: LEFT BREAST LUMPECTOMY WITH RADIOACTIVE SEED LOCALIZATION (Left) - LMA as a surgical intervention.  The patient's history has been reviewed, patient examined, no change in status, stable for surgery.  I have reviewed the patient's chart and labs.  Questions were answered to the patient's satisfaction.     Abigail Miyamoto

## 2020-06-12 NOTE — Op Note (Signed)
   Carrie Vang 06/12/2020   Pre-op Diagnosis: ATYPICAL DUCTAL HYPERPLASIA AND ATYPICAL CELLS WITH CALCIFICTIONS LEFT BREAST     Post-op Diagnosis: SAME  Procedure(s): LEFT BREAST LUMPECTOMY WITH RADIOACTIVE SEED LOCALIZATION  Surgeon(s): Abigail Miyamoto, MD  Anesthesia: General  Staff:  Circulator: Randalyn Rhea, RN Scrub Person: Wardell Heath, CST  Estimated Blood Loss: Minimal               Specimens: sent to path  Indications: This is a 54 year old female with a strong family history of breast cancer who was found to have 2 abnormalities in the left breast on screening mammography.  Both areas were biopsied.  1 showed atypical ductal hyperplasia and the other showed flat atypical cells with calcifications.  They were close together.  The decision was made to proceed with lumpectomy to remove both areas with radioactive seed guidance  Procedure: The patient was brought to the operating room identifies correct patient.  She is placed upon the operating table general anesthesia was induced.  Her left breast was prepped and draped in usual sterile fashion.  Using the neoprobe the 2 seeds were located in the outer lower quadrant of the left breast.  I anesthetized skin at the lateral edge of the areola with Marcaine and then made a circumareolar incision with a scalpel.  I then dissected down to the breast tissue with the cautery and then dissected laterally with the aid of the neoprobe.  The more lateral seed was the more posterior seed I dissected just past this and went down to the chest wall.  I then moved back to medially toward the more superficial, medial seed.  I then completed the lumpectomy with the cautery removing both suspicious areas in 1 lumpectomy specimen.  An x-ray was performed on the specimen after was marked with paint at the margins.  This confirmed that both radioactive seeds and clips were in the appropriate locations.  The more posterior clip was  at the edge of the specimen which was right on the chest wall.  The lumpectomy specimen sent to pathology for evaluation.  I achieved hemostasis with the cautery.  I anesthetized incision further with Marcaine.  I then closed the subcutaneous tissue with interrupted 3-0 Vicryl sutures and closed the skin with a running 4-0 Monocryl.  Dermabond was then applied.  The patient tolerated the procedure well.  All the counts were correct at the end of the procedure.  The patient was then extubated in the operating room and taken in a stable condition to the recovery room.          Abigail Miyamoto   Date: 06/12/2020  Time: 9:02 AM

## 2020-06-17 LAB — SURGICAL PATHOLOGY

## 2020-06-20 DIAGNOSIS — E785 Hyperlipidemia, unspecified: Secondary | ICD-10-CM | POA: Diagnosis not present

## 2020-06-24 ENCOUNTER — Other Ambulatory Visit: Payer: Self-pay

## 2020-06-24 ENCOUNTER — Ambulatory Visit
Admission: RE | Admit: 2020-06-24 | Discharge: 2020-06-24 | Disposition: A | Discharge status: Home / Self Care | Payer: BC Managed Care – PPO | Source: Ambulatory Visit | Attending: Orthopaedic Surgery | Admitting: Orthopaedic Surgery

## 2020-06-24 DIAGNOSIS — M25552 Pain in left hip: Secondary | ICD-10-CM

## 2020-06-24 DIAGNOSIS — R600 Localized edema: Secondary | ICD-10-CM | POA: Diagnosis not present

## 2020-06-24 DIAGNOSIS — S76312A Strain of muscle, fascia and tendon of the posterior muscle group at thigh level, left thigh, initial encounter: Secondary | ICD-10-CM | POA: Diagnosis not present

## 2020-06-24 DIAGNOSIS — M1612 Unilateral primary osteoarthritis, left hip: Secondary | ICD-10-CM | POA: Diagnosis not present

## 2020-06-24 DIAGNOSIS — G8929 Other chronic pain: Secondary | ICD-10-CM | POA: Diagnosis not present

## 2020-06-26 ENCOUNTER — Other Ambulatory Visit: Payer: Self-pay

## 2020-06-26 ENCOUNTER — Ambulatory Visit (INDEPENDENT_AMBULATORY_CARE_PROVIDER_SITE_OTHER): Payer: BC Managed Care – PPO | Admitting: Orthopaedic Surgery

## 2020-06-26 ENCOUNTER — Encounter: Payer: Self-pay | Admitting: Orthopaedic Surgery

## 2020-06-26 DIAGNOSIS — S76312D Strain of muscle, fascia and tendon of the posterior muscle group at thigh level, left thigh, subsequent encounter: Secondary | ICD-10-CM

## 2020-06-26 DIAGNOSIS — M1612 Unilateral primary osteoarthritis, left hip: Secondary | ICD-10-CM | POA: Diagnosis not present

## 2020-06-26 NOTE — Progress Notes (Signed)
The patient is an incredibly active and athletic 54 year old female who comes in after having a MRI of her left hip.  She had been having some chronic hip issues but it seems more around the ischial area than the hip joint itself.  She still denies any groin pain.  MRIs reviewed with her.  She does have a high-grade partial tear of the origin of the left hamstring tendon.  There is moderate arthritic changes in the left hip joint itself more central and medial than superior lateral.  I went over the MRI findings with her in detail.  She still denies any groin pain.  I put her left hip through internal and external rotation with no pain in the groin at all.  I would like to send her to specialized outpatient physical therapy with Carrie Vang since she is such an athletic person.  Any modalities to help the hamstring origin area would be great and looking at her exercise routine and making recommendations about what she should avoid as well.  We will see her back in about 3 months to make sure she is doing well.  We can always try a steroid injection around the ischium if needed.  All questions and concerns were answered and addressed.

## 2020-07-10 DIAGNOSIS — M79605 Pain in left leg: Secondary | ICD-10-CM | POA: Diagnosis not present

## 2020-07-10 DIAGNOSIS — R2689 Other abnormalities of gait and mobility: Secondary | ICD-10-CM | POA: Diagnosis not present

## 2020-07-25 DIAGNOSIS — M79605 Pain in left leg: Secondary | ICD-10-CM | POA: Diagnosis not present

## 2020-07-25 DIAGNOSIS — R2689 Other abnormalities of gait and mobility: Secondary | ICD-10-CM | POA: Diagnosis not present

## 2020-08-08 DIAGNOSIS — M79605 Pain in left leg: Secondary | ICD-10-CM | POA: Diagnosis not present

## 2020-08-08 DIAGNOSIS — R2689 Other abnormalities of gait and mobility: Secondary | ICD-10-CM | POA: Diagnosis not present

## 2020-08-21 DIAGNOSIS — R2689 Other abnormalities of gait and mobility: Secondary | ICD-10-CM | POA: Diagnosis not present

## 2020-08-21 DIAGNOSIS — M79605 Pain in left leg: Secondary | ICD-10-CM | POA: Diagnosis not present

## 2020-08-28 DIAGNOSIS — M79605 Pain in left leg: Secondary | ICD-10-CM | POA: Diagnosis not present

## 2020-08-28 DIAGNOSIS — R2689 Other abnormalities of gait and mobility: Secondary | ICD-10-CM | POA: Diagnosis not present

## 2020-09-11 DIAGNOSIS — M79605 Pain in left leg: Secondary | ICD-10-CM | POA: Diagnosis not present

## 2020-09-11 DIAGNOSIS — R2689 Other abnormalities of gait and mobility: Secondary | ICD-10-CM | POA: Diagnosis not present

## 2020-10-14 ENCOUNTER — Other Ambulatory Visit: Payer: Self-pay

## 2020-10-14 ENCOUNTER — Ambulatory Visit (INDEPENDENT_AMBULATORY_CARE_PROVIDER_SITE_OTHER): Payer: BC Managed Care – PPO

## 2020-10-14 ENCOUNTER — Encounter: Payer: Self-pay | Admitting: Podiatry

## 2020-10-14 ENCOUNTER — Ambulatory Visit (INDEPENDENT_AMBULATORY_CARE_PROVIDER_SITE_OTHER): Payer: BC Managed Care – PPO | Admitting: Podiatry

## 2020-10-14 DIAGNOSIS — M722 Plantar fascial fibromatosis: Secondary | ICD-10-CM

## 2020-10-14 DIAGNOSIS — M2042 Other hammer toe(s) (acquired), left foot: Secondary | ICD-10-CM

## 2020-10-14 DIAGNOSIS — M2041 Other hammer toe(s) (acquired), right foot: Secondary | ICD-10-CM | POA: Diagnosis not present

## 2020-10-14 NOTE — Progress Notes (Signed)
Subjective:   Patient ID: Carrie Vang, female   DOB: 55 y.o.   MRN: [id number]   HPI Patient states that she is getting pain in the second toes of both feet which can be bothersome and turned colors and sore and she is having trouble wearing shoe gear and also has had moderate chronic discomfort in her plantar fascia bilateral with history of orthotics.  Patient does not smoke likes to be active   Review of Systems  All other systems reviewed and are negative.       Objective:  Physical Exam Vitals and nursing note reviewed.  Constitutional:      Appearance: She is well-developed.  Pulmonary:     Effort: Pulmonary effort is normal.  Musculoskeletal:        General: Normal range of motion.  Skin:    General: Skin is warm.  Neurological:     Mental Status: She is alert.     Neurovascular status was found to be intact muscle strength found to be adequate range of motion adequate.  Patient is found to have moderate elevation digit to bilateral with reactive tissue formation and redness around the proximal joint surface where it is elevated and has moderate discomfort in the plantar fashion of both feet that can be sore at different times.  Patient has good digital perfusion well oriented x3     Assessment:  Chronic hammertoe deformity digits to both feet along with moderate plantar fascial symptomatology bilateral bilateral that she manages     Plan:  H&P x-rays reviewed discussed condition.  At this point I have recommended wider shoes mesh materials and I do think arthroplasties will be necessary and she wants to get this done but what needs to wait for several months.  I educated her on procedure she wants to get it done and is scheduled for surgery sometime in July and will be seen back for consult.  Planter fasciitis will continue to be treated conservatively at this point  X-rays indicate moderate elevation of the second digit bilateral with some depression of the  arch noted bilateral

## 2020-11-05 DIAGNOSIS — D225 Melanocytic nevi of trunk: Secondary | ICD-10-CM | POA: Diagnosis not present

## 2020-11-05 DIAGNOSIS — L710 Perioral dermatitis: Secondary | ICD-10-CM | POA: Diagnosis not present

## 2020-11-05 DIAGNOSIS — L578 Other skin changes due to chronic exposure to nonionizing radiation: Secondary | ICD-10-CM | POA: Diagnosis not present

## 2020-11-05 DIAGNOSIS — L821 Other seborrheic keratosis: Secondary | ICD-10-CM | POA: Diagnosis not present

## 2020-11-25 DIAGNOSIS — G44209 Tension-type headache, unspecified, not intractable: Secondary | ICD-10-CM | POA: Diagnosis not present

## 2020-11-25 DIAGNOSIS — M722 Plantar fascial fibromatosis: Secondary | ICD-10-CM | POA: Diagnosis not present

## 2020-11-25 DIAGNOSIS — M546 Pain in thoracic spine: Secondary | ICD-10-CM | POA: Diagnosis not present

## 2020-11-25 DIAGNOSIS — M9901 Segmental and somatic dysfunction of cervical region: Secondary | ICD-10-CM | POA: Diagnosis not present

## 2020-12-06 DIAGNOSIS — M546 Pain in thoracic spine: Secondary | ICD-10-CM | POA: Diagnosis not present

## 2020-12-06 DIAGNOSIS — G44209 Tension-type headache, unspecified, not intractable: Secondary | ICD-10-CM | POA: Diagnosis not present

## 2020-12-06 DIAGNOSIS — M9901 Segmental and somatic dysfunction of cervical region: Secondary | ICD-10-CM | POA: Diagnosis not present

## 2020-12-06 DIAGNOSIS — M722 Plantar fascial fibromatosis: Secondary | ICD-10-CM | POA: Diagnosis not present

## 2021-01-12 IMAGING — MG DIGITAL DIAGNOSTIC UNILAT LEFT W/ CAD
3 series · 3 of 3 positions shown · non-contrast
Comparison: Previous exam(s).

CLINICAL DATA: 53-year-old female presenting as a recall from
screening for possible left breast calcifications. Family history of
breast cancer in the patient's mother at age 54.

EXAM:
DIGITAL DIAGNOSTIC LEFT MAMMOGRAM

[L ML (1 of 2)]
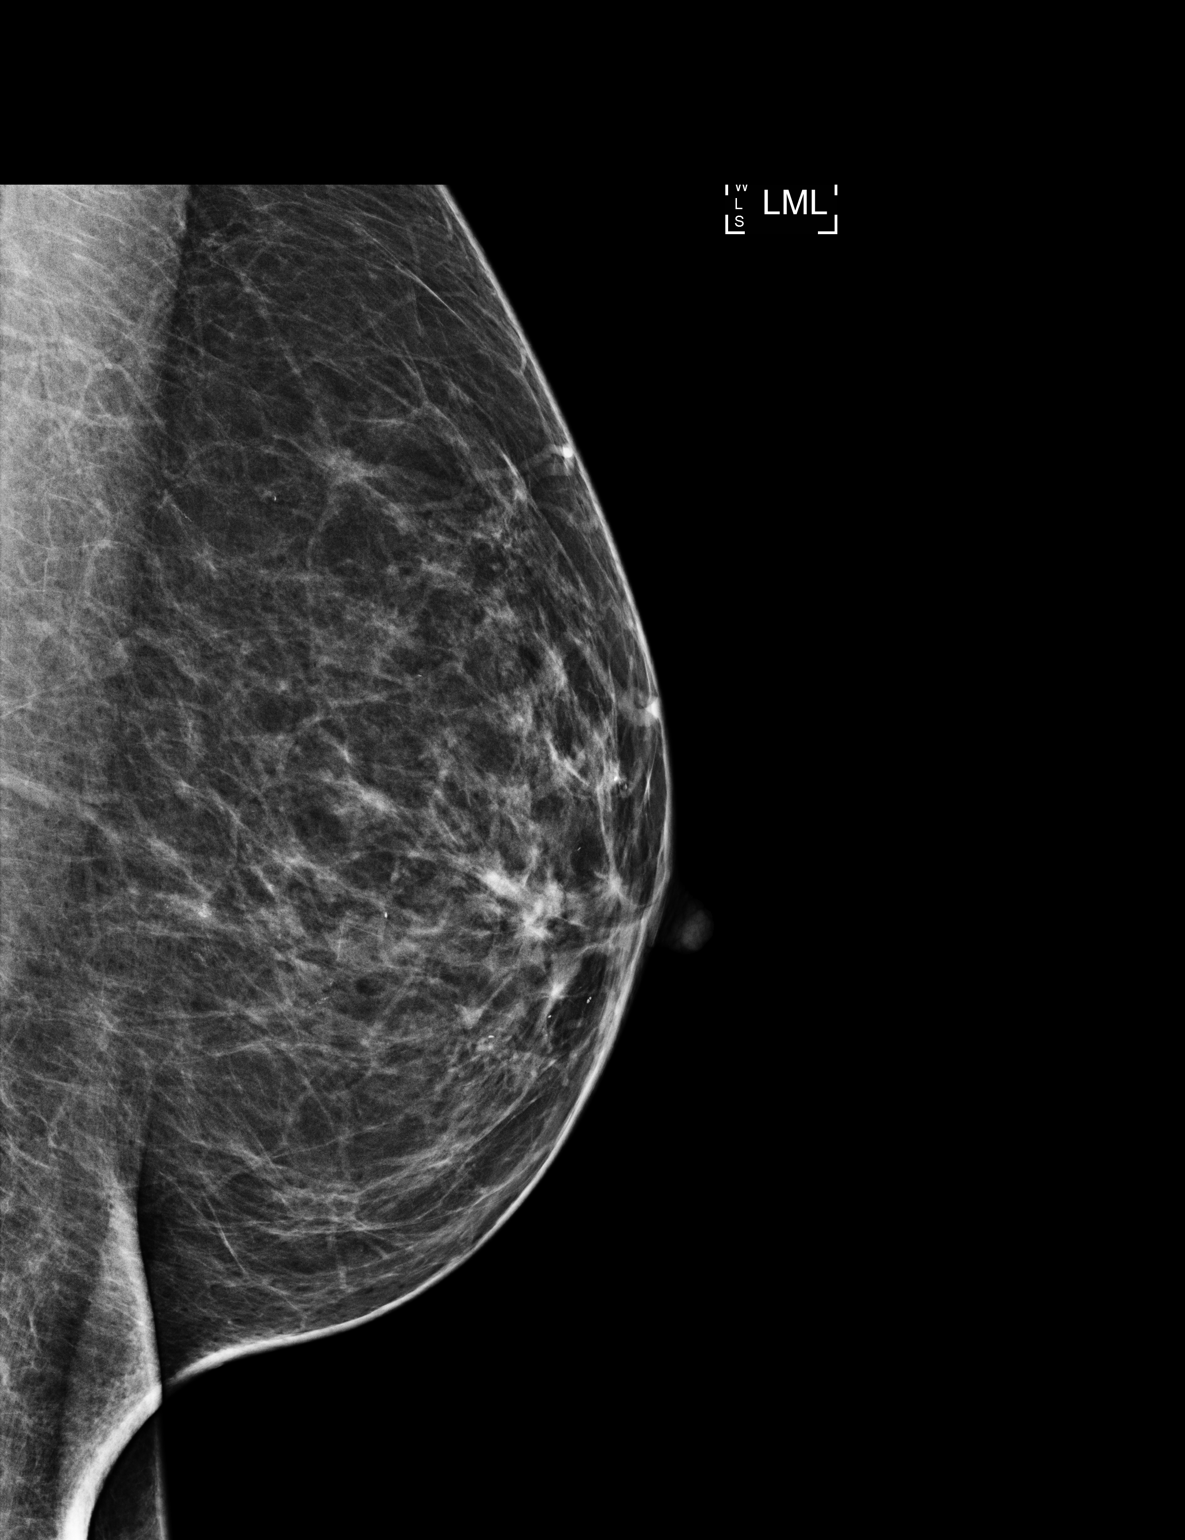

[L CC]
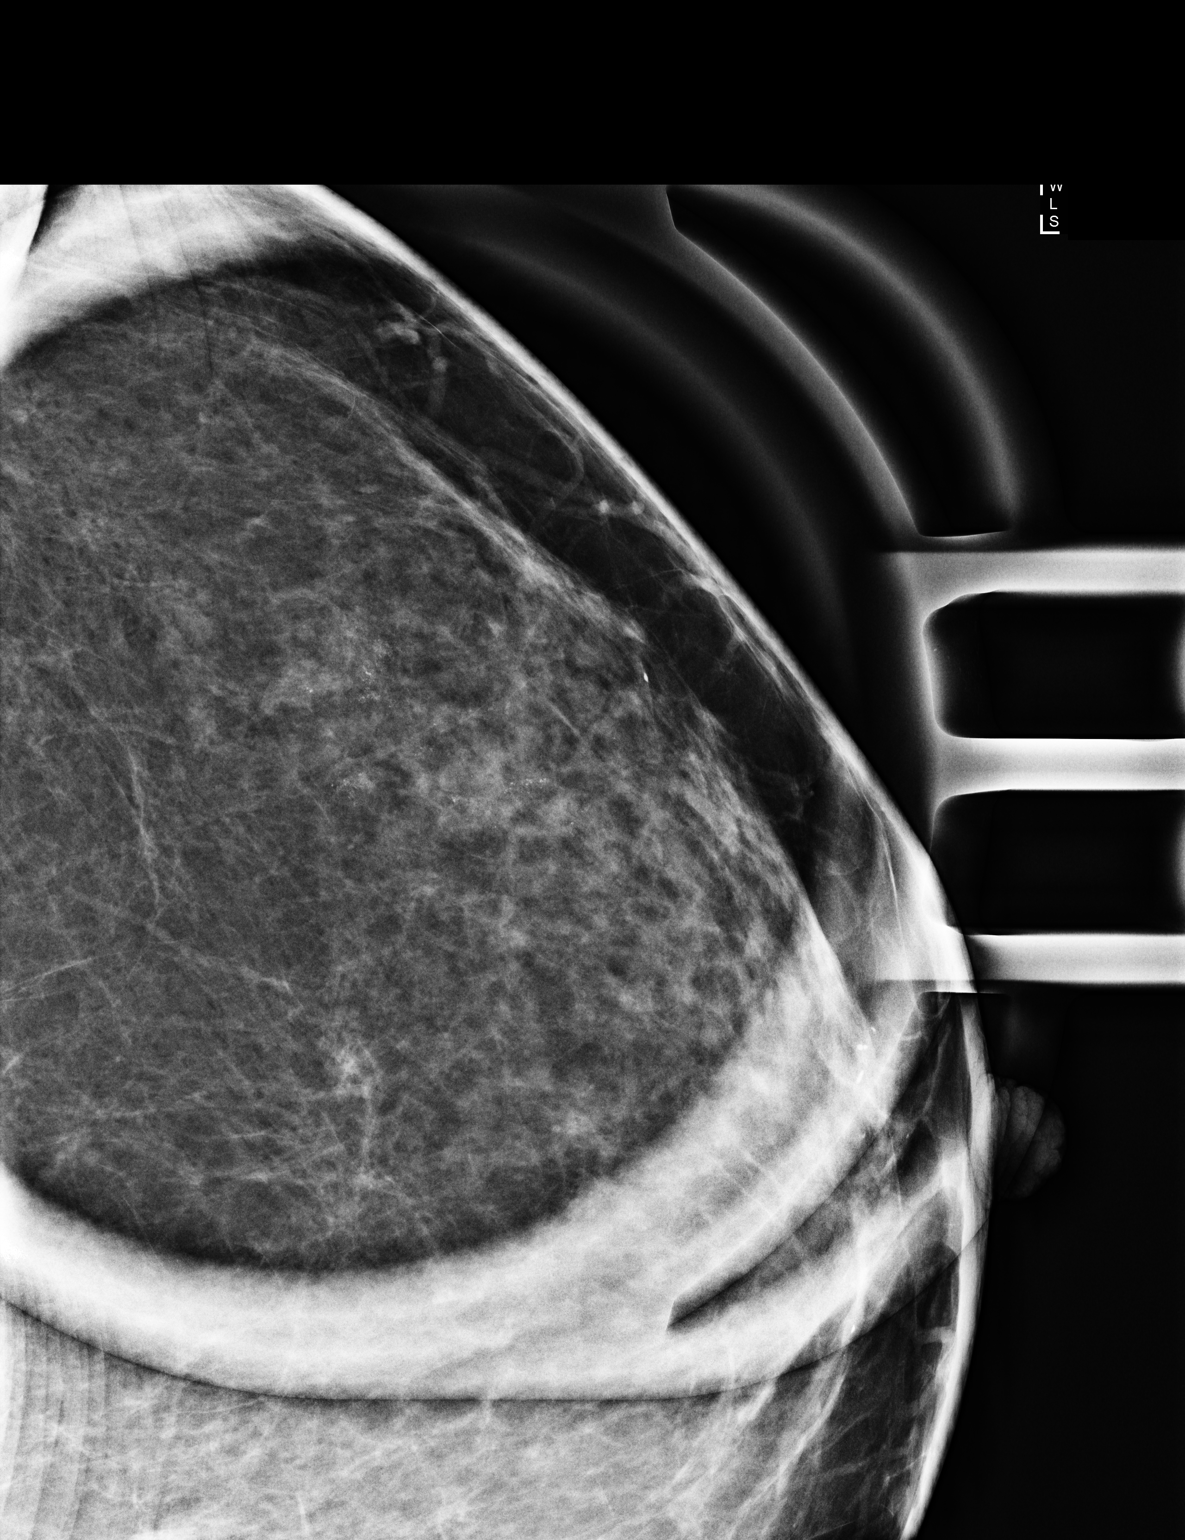

[L ML (2 of 2)]
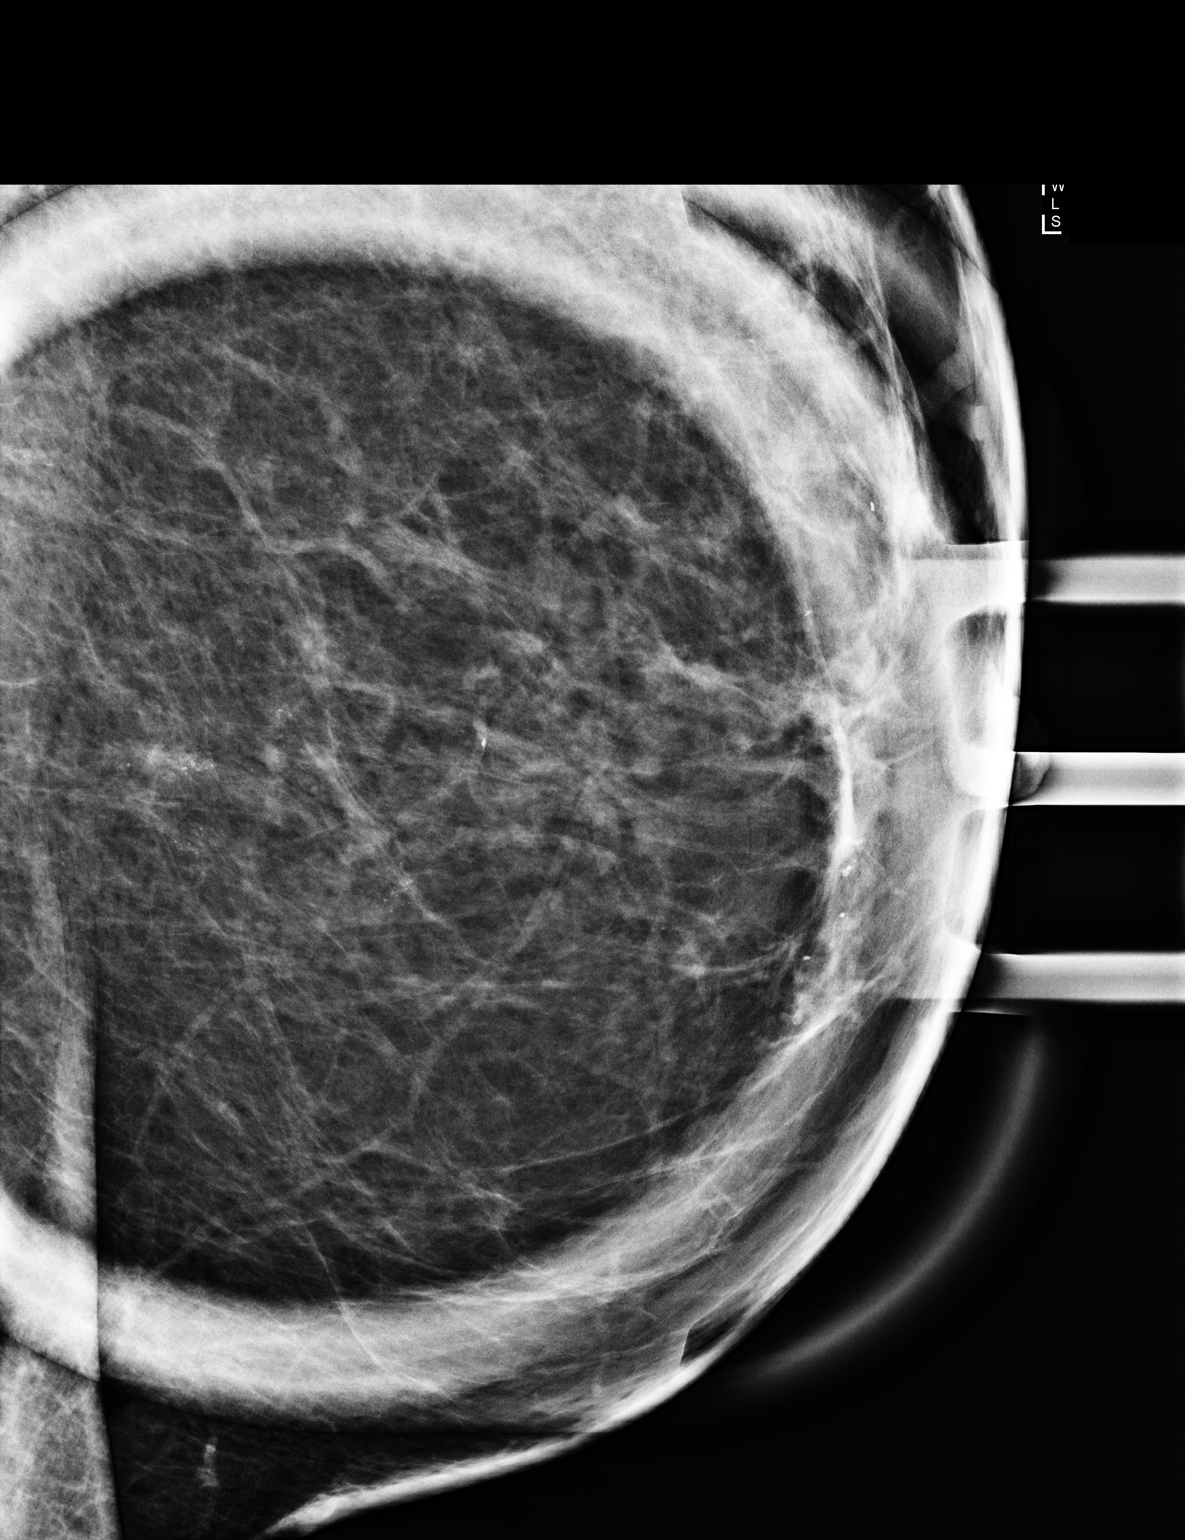

[3 of 3 positions shown; findings below may reference images not displayed]

ACR Breast Density Category b: There are scattered areas of
fibroglandular density.
FINDINGS: Spot 2D magnification and full field mL views of the left breast
performed. There are faint loosely grouped pleomorphic
calcifications in the outer left breast spanning approximately
cm. No suspicious associated mass. No additional new findings in the
left breast.
IMPRESSION: Indeterminate calcifications in the outer left breast spanning
cm.

RECOMMENDATION:
Stereotactic core needle biopsy x 2 of the calcifications in the
outer left breast.

I have discussed the findings and recommendations with the patient.
If applicable, a reminder letter will be sent to the patient
regarding the next appointment.

BI-RADS CATEGORY  4: Suspicious.

## 2021-04-02 DIAGNOSIS — E039 Hypothyroidism, unspecified: Secondary | ICD-10-CM | POA: Diagnosis not present

## 2021-04-02 DIAGNOSIS — Z01419 Encounter for gynecological examination (general) (routine) without abnormal findings: Secondary | ICD-10-CM | POA: Diagnosis not present

## 2021-04-02 DIAGNOSIS — Z6821 Body mass index (BMI) 21.0-21.9, adult: Secondary | ICD-10-CM | POA: Diagnosis not present

## 2021-04-02 DIAGNOSIS — D6851 Activated protein C resistance: Secondary | ICD-10-CM | POA: Diagnosis not present

## 2021-04-02 DIAGNOSIS — Z1231 Encounter for screening mammogram for malignant neoplasm of breast: Secondary | ICD-10-CM | POA: Diagnosis not present

## 2021-04-07 ENCOUNTER — Ambulatory Visit: Payer: BC Managed Care – PPO | Admitting: Orthopaedic Surgery

## 2021-05-15 DIAGNOSIS — Z13220 Encounter for screening for lipoid disorders: Secondary | ICD-10-CM | POA: Diagnosis not present

## 2021-05-15 DIAGNOSIS — F5101 Primary insomnia: Secondary | ICD-10-CM | POA: Diagnosis not present

## 2021-05-15 DIAGNOSIS — E039 Hypothyroidism, unspecified: Secondary | ICD-10-CM | POA: Diagnosis not present

## 2021-05-15 DIAGNOSIS — Z0000 Encounter for general adult medical examination without abnormal findings: Secondary | ICD-10-CM | POA: Diagnosis not present

## 2021-06-13 DIAGNOSIS — R058 Other specified cough: Secondary | ICD-10-CM | POA: Diagnosis not present

## 2021-06-13 DIAGNOSIS — J069 Acute upper respiratory infection, unspecified: Secondary | ICD-10-CM | POA: Diagnosis not present

## 2021-06-13 DIAGNOSIS — J3489 Other specified disorders of nose and nasal sinuses: Secondary | ICD-10-CM | POA: Diagnosis not present

## 2021-06-13 DIAGNOSIS — R0602 Shortness of breath: Secondary | ICD-10-CM | POA: Diagnosis not present

## 2021-06-19 DIAGNOSIS — J208 Acute bronchitis due to other specified organisms: Secondary | ICD-10-CM | POA: Diagnosis not present

## 2021-06-26 DIAGNOSIS — E039 Hypothyroidism, unspecified: Secondary | ICD-10-CM | POA: Diagnosis not present

## 2021-06-26 DIAGNOSIS — Z13820 Encounter for screening for osteoporosis: Secondary | ICD-10-CM | POA: Diagnosis not present

## 2021-06-26 DIAGNOSIS — M8588 Other specified disorders of bone density and structure, other site: Secondary | ICD-10-CM | POA: Diagnosis not present

## 2021-06-26 DIAGNOSIS — N958 Other specified menopausal and perimenopausal disorders: Secondary | ICD-10-CM | POA: Diagnosis not present

## 2021-07-24 DIAGNOSIS — Z79890 Hormone replacement therapy (postmenopausal): Secondary | ICD-10-CM | POA: Diagnosis not present

## 2021-07-24 DIAGNOSIS — M810 Age-related osteoporosis without current pathological fracture: Secondary | ICD-10-CM | POA: Diagnosis not present

## 2021-07-25 DIAGNOSIS — Z713 Dietary counseling and surveillance: Secondary | ICD-10-CM | POA: Diagnosis not present

## 2021-08-19 ENCOUNTER — Encounter (HOSPITAL_COMMUNITY): Payer: Self-pay

## 2021-09-25 DIAGNOSIS — M9904 Segmental and somatic dysfunction of sacral region: Secondary | ICD-10-CM | POA: Diagnosis not present

## 2021-09-25 DIAGNOSIS — M9903 Segmental and somatic dysfunction of lumbar region: Secondary | ICD-10-CM | POA: Diagnosis not present

## 2021-09-25 DIAGNOSIS — M62830 Muscle spasm of back: Secondary | ICD-10-CM | POA: Diagnosis not present

## 2021-09-25 DIAGNOSIS — M545 Low back pain, unspecified: Secondary | ICD-10-CM | POA: Diagnosis not present

## 2021-10-02 DIAGNOSIS — M62830 Muscle spasm of back: Secondary | ICD-10-CM | POA: Diagnosis not present

## 2021-10-02 DIAGNOSIS — M9903 Segmental and somatic dysfunction of lumbar region: Secondary | ICD-10-CM | POA: Diagnosis not present

## 2021-10-02 DIAGNOSIS — M9904 Segmental and somatic dysfunction of sacral region: Secondary | ICD-10-CM | POA: Diagnosis not present

## 2021-10-02 DIAGNOSIS — M545 Low back pain, unspecified: Secondary | ICD-10-CM | POA: Diagnosis not present

## 2021-10-09 DIAGNOSIS — M9903 Segmental and somatic dysfunction of lumbar region: Secondary | ICD-10-CM | POA: Diagnosis not present

## 2021-10-09 DIAGNOSIS — M9904 Segmental and somatic dysfunction of sacral region: Secondary | ICD-10-CM | POA: Diagnosis not present

## 2021-10-09 DIAGNOSIS — M545 Low back pain, unspecified: Secondary | ICD-10-CM | POA: Diagnosis not present

## 2021-10-09 DIAGNOSIS — M62830 Muscle spasm of back: Secondary | ICD-10-CM | POA: Diagnosis not present

## 2021-10-10 DIAGNOSIS — G5762 Lesion of plantar nerve, left lower limb: Secondary | ICD-10-CM | POA: Diagnosis not present

## 2021-10-10 DIAGNOSIS — M79671 Pain in right foot: Secondary | ICD-10-CM | POA: Diagnosis not present

## 2021-10-10 DIAGNOSIS — M79672 Pain in left foot: Secondary | ICD-10-CM | POA: Diagnosis not present

## 2021-10-10 DIAGNOSIS — M2021 Hallux rigidus, right foot: Secondary | ICD-10-CM | POA: Diagnosis not present

## 2021-10-13 DIAGNOSIS — M9903 Segmental and somatic dysfunction of lumbar region: Secondary | ICD-10-CM | POA: Diagnosis not present

## 2021-10-13 DIAGNOSIS — N644 Mastodynia: Secondary | ICD-10-CM | POA: Diagnosis not present

## 2021-10-13 DIAGNOSIS — M62830 Muscle spasm of back: Secondary | ICD-10-CM | POA: Diagnosis not present

## 2021-10-13 DIAGNOSIS — M9904 Segmental and somatic dysfunction of sacral region: Secondary | ICD-10-CM | POA: Diagnosis not present

## 2021-10-13 DIAGNOSIS — M545 Low back pain, unspecified: Secondary | ICD-10-CM | POA: Diagnosis not present

## 2021-10-14 DIAGNOSIS — M62830 Muscle spasm of back: Secondary | ICD-10-CM | POA: Diagnosis not present

## 2021-10-14 DIAGNOSIS — M545 Low back pain, unspecified: Secondary | ICD-10-CM | POA: Diagnosis not present

## 2021-10-14 DIAGNOSIS — M9904 Segmental and somatic dysfunction of sacral region: Secondary | ICD-10-CM | POA: Diagnosis not present

## 2021-10-14 DIAGNOSIS — M9903 Segmental and somatic dysfunction of lumbar region: Secondary | ICD-10-CM | POA: Diagnosis not present

## 2021-10-15 DIAGNOSIS — M9903 Segmental and somatic dysfunction of lumbar region: Secondary | ICD-10-CM | POA: Diagnosis not present

## 2021-10-15 DIAGNOSIS — M62830 Muscle spasm of back: Secondary | ICD-10-CM | POA: Diagnosis not present

## 2021-10-15 DIAGNOSIS — M545 Low back pain, unspecified: Secondary | ICD-10-CM | POA: Diagnosis not present

## 2021-10-15 DIAGNOSIS — M9904 Segmental and somatic dysfunction of sacral region: Secondary | ICD-10-CM | POA: Diagnosis not present

## 2021-10-21 DIAGNOSIS — M62830 Muscle spasm of back: Secondary | ICD-10-CM | POA: Diagnosis not present

## 2021-10-21 DIAGNOSIS — M545 Low back pain, unspecified: Secondary | ICD-10-CM | POA: Diagnosis not present

## 2021-10-21 DIAGNOSIS — M9904 Segmental and somatic dysfunction of sacral region: Secondary | ICD-10-CM | POA: Diagnosis not present

## 2021-10-21 DIAGNOSIS — M9903 Segmental and somatic dysfunction of lumbar region: Secondary | ICD-10-CM | POA: Diagnosis not present

## 2021-10-22 DIAGNOSIS — M9904 Segmental and somatic dysfunction of sacral region: Secondary | ICD-10-CM | POA: Diagnosis not present

## 2021-10-22 DIAGNOSIS — M9903 Segmental and somatic dysfunction of lumbar region: Secondary | ICD-10-CM | POA: Diagnosis not present

## 2021-10-22 DIAGNOSIS — M545 Low back pain, unspecified: Secondary | ICD-10-CM | POA: Diagnosis not present

## 2021-10-22 DIAGNOSIS — M62830 Muscle spasm of back: Secondary | ICD-10-CM | POA: Diagnosis not present

## 2021-10-23 DIAGNOSIS — M9904 Segmental and somatic dysfunction of sacral region: Secondary | ICD-10-CM | POA: Diagnosis not present

## 2021-10-23 DIAGNOSIS — M545 Low back pain, unspecified: Secondary | ICD-10-CM | POA: Diagnosis not present

## 2021-10-23 DIAGNOSIS — M9903 Segmental and somatic dysfunction of lumbar region: Secondary | ICD-10-CM | POA: Diagnosis not present

## 2021-10-23 DIAGNOSIS — M62830 Muscle spasm of back: Secondary | ICD-10-CM | POA: Diagnosis not present

## 2021-10-29 DIAGNOSIS — M5412 Radiculopathy, cervical region: Secondary | ICD-10-CM | POA: Diagnosis not present

## 2021-10-29 DIAGNOSIS — M545 Low back pain, unspecified: Secondary | ICD-10-CM | POA: Diagnosis not present

## 2021-10-29 DIAGNOSIS — M9903 Segmental and somatic dysfunction of lumbar region: Secondary | ICD-10-CM | POA: Diagnosis not present

## 2021-10-29 DIAGNOSIS — M62830 Muscle spasm of back: Secondary | ICD-10-CM | POA: Diagnosis not present

## 2021-10-29 DIAGNOSIS — M9904 Segmental and somatic dysfunction of sacral region: Secondary | ICD-10-CM | POA: Diagnosis not present

## 2021-10-30 ENCOUNTER — Other Ambulatory Visit: Payer: Self-pay | Admitting: Physician Assistant

## 2021-10-30 DIAGNOSIS — M62830 Muscle spasm of back: Secondary | ICD-10-CM | POA: Diagnosis not present

## 2021-10-30 DIAGNOSIS — M9904 Segmental and somatic dysfunction of sacral region: Secondary | ICD-10-CM | POA: Diagnosis not present

## 2021-10-30 DIAGNOSIS — M5412 Radiculopathy, cervical region: Secondary | ICD-10-CM

## 2021-10-30 DIAGNOSIS — M545 Low back pain, unspecified: Secondary | ICD-10-CM | POA: Diagnosis not present

## 2021-10-30 DIAGNOSIS — M9903 Segmental and somatic dysfunction of lumbar region: Secondary | ICD-10-CM | POA: Diagnosis not present

## 2021-10-31 DIAGNOSIS — M62830 Muscle spasm of back: Secondary | ICD-10-CM | POA: Diagnosis not present

## 2021-10-31 DIAGNOSIS — M9904 Segmental and somatic dysfunction of sacral region: Secondary | ICD-10-CM | POA: Diagnosis not present

## 2021-10-31 DIAGNOSIS — M9903 Segmental and somatic dysfunction of lumbar region: Secondary | ICD-10-CM | POA: Diagnosis not present

## 2021-10-31 DIAGNOSIS — M545 Low back pain, unspecified: Secondary | ICD-10-CM | POA: Diagnosis not present

## 2021-11-04 DIAGNOSIS — M545 Low back pain, unspecified: Secondary | ICD-10-CM | POA: Diagnosis not present

## 2021-11-04 DIAGNOSIS — M62830 Muscle spasm of back: Secondary | ICD-10-CM | POA: Diagnosis not present

## 2021-11-04 DIAGNOSIS — M9904 Segmental and somatic dysfunction of sacral region: Secondary | ICD-10-CM | POA: Diagnosis not present

## 2021-11-04 DIAGNOSIS — M9903 Segmental and somatic dysfunction of lumbar region: Secondary | ICD-10-CM | POA: Diagnosis not present

## 2021-11-05 DIAGNOSIS — L578 Other skin changes due to chronic exposure to nonionizing radiation: Secondary | ICD-10-CM | POA: Diagnosis not present

## 2021-11-05 DIAGNOSIS — D2230 Melanocytic nevi of unspecified part of face: Secondary | ICD-10-CM | POA: Diagnosis not present

## 2021-11-05 DIAGNOSIS — D225 Melanocytic nevi of trunk: Secondary | ICD-10-CM | POA: Diagnosis not present

## 2021-11-05 DIAGNOSIS — D2271 Melanocytic nevi of right lower limb, including hip: Secondary | ICD-10-CM | POA: Diagnosis not present

## 2021-11-05 DIAGNOSIS — L570 Actinic keratosis: Secondary | ICD-10-CM | POA: Diagnosis not present

## 2021-11-06 DIAGNOSIS — M62830 Muscle spasm of back: Secondary | ICD-10-CM | POA: Diagnosis not present

## 2021-11-06 DIAGNOSIS — M9903 Segmental and somatic dysfunction of lumbar region: Secondary | ICD-10-CM | POA: Diagnosis not present

## 2021-11-06 DIAGNOSIS — M545 Low back pain, unspecified: Secondary | ICD-10-CM | POA: Diagnosis not present

## 2021-11-06 DIAGNOSIS — M9904 Segmental and somatic dysfunction of sacral region: Secondary | ICD-10-CM | POA: Diagnosis not present

## 2021-11-07 DIAGNOSIS — M9904 Segmental and somatic dysfunction of sacral region: Secondary | ICD-10-CM | POA: Diagnosis not present

## 2021-11-07 DIAGNOSIS — M545 Low back pain, unspecified: Secondary | ICD-10-CM | POA: Diagnosis not present

## 2021-11-07 DIAGNOSIS — M9903 Segmental and somatic dysfunction of lumbar region: Secondary | ICD-10-CM | POA: Diagnosis not present

## 2021-11-07 DIAGNOSIS — M62830 Muscle spasm of back: Secondary | ICD-10-CM | POA: Diagnosis not present

## 2021-11-11 DIAGNOSIS — M9903 Segmental and somatic dysfunction of lumbar region: Secondary | ICD-10-CM | POA: Diagnosis not present

## 2021-11-11 DIAGNOSIS — M545 Low back pain, unspecified: Secondary | ICD-10-CM | POA: Diagnosis not present

## 2021-11-11 DIAGNOSIS — M62830 Muscle spasm of back: Secondary | ICD-10-CM | POA: Diagnosis not present

## 2021-11-11 DIAGNOSIS — M9904 Segmental and somatic dysfunction of sacral region: Secondary | ICD-10-CM | POA: Diagnosis not present

## 2021-11-13 ENCOUNTER — Ambulatory Visit
Admission: RE | Admit: 2021-11-13 | Discharge: 2021-11-13 | Disposition: A | Discharge status: Home / Self Care | Payer: BC Managed Care – PPO | Source: Ambulatory Visit | Attending: Physician Assistant | Admitting: Physician Assistant

## 2021-11-13 ENCOUNTER — Telehealth: Payer: Self-pay | Admitting: Orthopaedic Surgery

## 2021-11-13 DIAGNOSIS — M9904 Segmental and somatic dysfunction of sacral region: Secondary | ICD-10-CM | POA: Diagnosis not present

## 2021-11-13 DIAGNOSIS — M62830 Muscle spasm of back: Secondary | ICD-10-CM | POA: Diagnosis not present

## 2021-11-13 DIAGNOSIS — M4802 Spinal stenosis, cervical region: Secondary | ICD-10-CM | POA: Diagnosis not present

## 2021-11-13 DIAGNOSIS — M545 Low back pain, unspecified: Secondary | ICD-10-CM | POA: Diagnosis not present

## 2021-11-13 DIAGNOSIS — M5412 Radiculopathy, cervical region: Secondary | ICD-10-CM

## 2021-11-13 DIAGNOSIS — M9903 Segmental and somatic dysfunction of lumbar region: Secondary | ICD-10-CM | POA: Diagnosis not present

## 2021-11-13 NOTE — Telephone Encounter (Signed)
Pt states she is having an mri on 05/11 fo her neck but she was wondering if Dr.Blackman can put in for her hip pain as well?  ? ?CB [phone]  ?

## 2021-11-13 NOTE — Telephone Encounter (Signed)
Patient aware.

## 2021-11-18 DIAGNOSIS — M62830 Muscle spasm of back: Secondary | ICD-10-CM | POA: Diagnosis not present

## 2021-11-18 DIAGNOSIS — M545 Low back pain, unspecified: Secondary | ICD-10-CM | POA: Diagnosis not present

## 2021-11-18 DIAGNOSIS — M9903 Segmental and somatic dysfunction of lumbar region: Secondary | ICD-10-CM | POA: Diagnosis not present

## 2021-11-18 DIAGNOSIS — M9904 Segmental and somatic dysfunction of sacral region: Secondary | ICD-10-CM | POA: Diagnosis not present

## 2021-11-19 ENCOUNTER — Encounter: Payer: Self-pay | Admitting: Orthopaedic Surgery

## 2021-11-19 ENCOUNTER — Ambulatory Visit (INDEPENDENT_AMBULATORY_CARE_PROVIDER_SITE_OTHER): Payer: BC Managed Care – PPO

## 2021-11-19 ENCOUNTER — Other Ambulatory Visit: Payer: Self-pay

## 2021-11-19 ENCOUNTER — Ambulatory Visit (INDEPENDENT_AMBULATORY_CARE_PROVIDER_SITE_OTHER): Payer: BC Managed Care – PPO | Admitting: Orthopaedic Surgery

## 2021-11-19 DIAGNOSIS — M25552 Pain in left hip: Secondary | ICD-10-CM

## 2021-11-19 NOTE — Progress Notes (Signed)
The patient is very well-known to me.  She is an active 56 year old female with a diagnosis of moderate arthritis of her left hip seen on MRI in 2021.  She is an avid biker and has a big biking event later in the summer.  She is not having groin pain again recently and some thigh pain.  She also has a chronic hamstring tear and has seen a physical therapist in the past.  She has had otherwise no acute change in medical status.  She is not diabetic. ? ?Examination of her left hip does show some pain on internal extra rotation at the extremes as well as pain with extreme hip flexion. ? ?An AP pelvis and lateral left hip shows only some mild arthritic changes.  It was more moderate on her MRI in 2021 of left hip. ? ?I am recommending an intra-articular steroid injection in her left hip joint but not until mid July before her biking trip.  She agrees with this treatment plan.  She can try alternating Tylenol arthritis and Aleve I recommended her considering starting a supplement such as turmeric.  She agrees with this treatment plan.  We will work on getting her set up for steroid injection in about 2 months from now in that left hip by Dr. Alvester Morin.  Follow-up is as needed with me. ?

## 2021-11-20 DIAGNOSIS — M9904 Segmental and somatic dysfunction of sacral region: Secondary | ICD-10-CM | POA: Diagnosis not present

## 2021-11-20 DIAGNOSIS — M62830 Muscle spasm of back: Secondary | ICD-10-CM | POA: Diagnosis not present

## 2021-11-20 DIAGNOSIS — M9903 Segmental and somatic dysfunction of lumbar region: Secondary | ICD-10-CM | POA: Diagnosis not present

## 2021-11-20 DIAGNOSIS — M545 Low back pain, unspecified: Secondary | ICD-10-CM | POA: Diagnosis not present

## 2021-11-25 DIAGNOSIS — M545 Low back pain, unspecified: Secondary | ICD-10-CM | POA: Diagnosis not present

## 2021-11-25 DIAGNOSIS — M542 Cervicalgia: Secondary | ICD-10-CM | POA: Diagnosis not present

## 2021-12-09 DIAGNOSIS — M62830 Muscle spasm of back: Secondary | ICD-10-CM | POA: Diagnosis not present

## 2021-12-09 DIAGNOSIS — M9903 Segmental and somatic dysfunction of lumbar region: Secondary | ICD-10-CM | POA: Diagnosis not present

## 2021-12-09 DIAGNOSIS — M9904 Segmental and somatic dysfunction of sacral region: Secondary | ICD-10-CM | POA: Diagnosis not present

## 2021-12-09 DIAGNOSIS — M545 Low back pain, unspecified: Secondary | ICD-10-CM | POA: Diagnosis not present

## 2021-12-09 DIAGNOSIS — M542 Cervicalgia: Secondary | ICD-10-CM | POA: Diagnosis not present

## 2021-12-16 DIAGNOSIS — M545 Low back pain, unspecified: Secondary | ICD-10-CM | POA: Diagnosis not present

## 2021-12-16 DIAGNOSIS — M542 Cervicalgia: Secondary | ICD-10-CM | POA: Diagnosis not present

## 2022-01-14 DIAGNOSIS — M5030 Other cervical disc degeneration, unspecified cervical region: Secondary | ICD-10-CM | POA: Diagnosis not present

## 2022-01-14 DIAGNOSIS — M25512 Pain in left shoulder: Secondary | ICD-10-CM | POA: Diagnosis not present

## 2022-01-21 ENCOUNTER — Encounter: Payer: Self-pay | Admitting: Physical Medicine and Rehabilitation

## 2022-01-21 ENCOUNTER — Ambulatory Visit: Payer: Self-pay

## 2022-01-21 ENCOUNTER — Ambulatory Visit (INDEPENDENT_AMBULATORY_CARE_PROVIDER_SITE_OTHER): Payer: BC Managed Care – PPO | Admitting: Physical Medicine and Rehabilitation

## 2022-01-21 DIAGNOSIS — M25552 Pain in left hip: Secondary | ICD-10-CM

## 2022-01-21 MED ORDER — BUPIVACAINE HCL 0.25 % IJ SOLN
4.0000 mL | INTRAMUSCULAR | Status: AC | PRN
  Administered 2022-01-21: 4 mL via INTRA_ARTICULAR

## 2022-01-21 MED ORDER — TRIAMCINOLONE ACETONIDE 40 MG/ML IJ SUSP
60.0000 mg | INTRAMUSCULAR | Status: AC | PRN
  Administered 2022-01-21: 60 mg via INTRA_ARTICULAR

## 2022-01-21 NOTE — Progress Notes (Signed)
Pt state left hip pain. Pt state walking, standing and laying down makes the pain worse. Pt state she takes over the counter pain meds to help ease her pain.  Numeric Pain Rating Scale and Functional Assessment Average Pain 3   In the last MONTH (on 0-10 scale) has pain interfered with the following?  1. General activity like being  able to carry out your everyday physical activities such as walking, climbing stairs, carrying groceries, or moving a chair?  Rating(7)   -BT, -Dye Allergies.

## 2022-01-21 NOTE — Progress Notes (Signed)
   Carrie Vang - 56 y.o. female MRN [id number]  Date of birth: 01/30/1966  Office Visit Note: Visit Date: 01/21/2022 PCP: Merri Brunette, MD Referred by: Merri Brunette, MD  Subjective: Chief Complaint  Patient presents with   Left Hip - Pain   HPI:  Carrie Vang is a 56 y.o. female who comes in today at the request of Dr. Doneen Poisson for planned Left anesthetic hip arthrogram with fluoroscopic guidance.  The patient has failed conservative care including home exercise, medications, time and activity modification.  This injection will be diagnostic and hopefully therapeutic.  Please see requesting physician notes for further details and justification.   ROS Otherwise per HPI.  Assessment & Plan: Visit Diagnoses:    ICD-10-CM   1. Pain in left hip  M25.552 XR C-ARM NO REPORT    Large Joint Inj: L hip joint      Plan: No additional findings.   Meds & Orders: No orders of the defined types were placed in this encounter.   Orders Placed This Encounter  Procedures   Large Joint Inj: L hip joint   XR C-ARM NO REPORT    Follow-up: Return for visit to requesting provider as needed.   Procedures: Large Joint Inj: L hip joint on 01/21/2022 1:02 PM Indications: diagnostic evaluation and pain Details: 22 G 3.5 in needle, fluoroscopy-guided anterior approach  Arthrogram: No  Medications: 4 mL bupivacaine 0.25 %; 60 mg triamcinolone acetonide 40 MG/ML Outcome: tolerated well, no immediate complications  There was excellent flow of contrast producing a partial arthrogram of the hip.  Procedure, treatment alternatives, risks and benefits explained, specific risks discussed. Consent was given by the patient. Immediately prior to procedure a time out was called to verify the correct patient, procedure, equipment, support staff and site/side marked as required. Patient was prepped and draped in the usual sterile fashion.          Clinical History: No  specialty comments available.     Objective:  VS:  HT:    WT:   BMI:     BP:   HR: bpm  TEMP: ( )  RESP:  Physical Exam   Imaging: No results found.

## 2022-01-27 ENCOUNTER — Other Ambulatory Visit: Payer: Self-pay | Admitting: Orthopaedic Surgery

## 2022-01-27 ENCOUNTER — Telehealth: Payer: Self-pay | Admitting: Orthopaedic Surgery

## 2022-01-27 MED ORDER — HYDROCODONE-ACETAMINOPHEN 5-325 MG PO TABS
1.0000 | ORAL_TABLET | Freq: Four times a day (QID) | ORAL | 0 refills | Status: DC | PRN
Start: 2022-01-27 — End: 2023-01-26

## 2022-01-27 MED ORDER — NABUMETONE 750 MG PO TABS: 750.0000 mg | ORAL_TABLET | Freq: Two times a day (BID) | ORAL | 1 refills | Status: DC | PRN

## 2022-01-27 NOTE — Telephone Encounter (Signed)
Patient aware of the below message from Blackman  

## 2022-01-27 NOTE — Telephone Encounter (Signed)
Pt called requesting a call back from Dr. Magnus Ivan sometime today. Pt states she received an injection from Dr. Alvester Morin and made her pains worse. Pt states she is going out of town and need to know what she can do or can Dr prescribe something. Please call pt at [phone].

## 2022-02-05 DIAGNOSIS — D485 Neoplasm of uncertain behavior of skin: Secondary | ICD-10-CM | POA: Diagnosis not present

## 2022-02-05 DIAGNOSIS — L308 Other specified dermatitis: Secondary | ICD-10-CM | POA: Diagnosis not present

## 2022-03-12 DIAGNOSIS — M542 Cervicalgia: Secondary | ICD-10-CM | POA: Diagnosis not present

## 2022-03-31 DIAGNOSIS — F332 Major depressive disorder, recurrent severe without psychotic features: Secondary | ICD-10-CM | POA: Diagnosis not present

## 2022-05-14 DIAGNOSIS — H16143 Punctate keratitis, bilateral: Secondary | ICD-10-CM | POA: Diagnosis not present

## 2022-05-18 DIAGNOSIS — M810 Age-related osteoporosis without current pathological fracture: Secondary | ICD-10-CM | POA: Diagnosis not present

## 2022-05-18 DIAGNOSIS — E611 Iron deficiency: Secondary | ICD-10-CM | POA: Diagnosis not present

## 2022-05-18 DIAGNOSIS — E559 Vitamin D deficiency, unspecified: Secondary | ICD-10-CM | POA: Diagnosis not present

## 2022-05-18 DIAGNOSIS — E039 Hypothyroidism, unspecified: Secondary | ICD-10-CM | POA: Diagnosis not present

## 2022-05-18 DIAGNOSIS — E538 Deficiency of other specified B group vitamins: Secondary | ICD-10-CM | POA: Diagnosis not present

## 2022-05-18 DIAGNOSIS — R5383 Other fatigue: Secondary | ICD-10-CM | POA: Diagnosis not present

## 2022-05-18 DIAGNOSIS — N951 Menopausal and female climacteric states: Secondary | ICD-10-CM | POA: Diagnosis not present

## 2022-05-19 DIAGNOSIS — Z6822 Body mass index (BMI) 22.0-22.9, adult: Secondary | ICD-10-CM | POA: Diagnosis not present

## 2022-05-19 DIAGNOSIS — Z01419 Encounter for gynecological examination (general) (routine) without abnormal findings: Secondary | ICD-10-CM | POA: Diagnosis not present

## 2022-05-19 DIAGNOSIS — Z1231 Encounter for screening mammogram for malignant neoplasm of breast: Secondary | ICD-10-CM | POA: Diagnosis not present

## 2022-06-04 DIAGNOSIS — Z13220 Encounter for screening for lipoid disorders: Secondary | ICD-10-CM | POA: Diagnosis not present

## 2022-06-04 DIAGNOSIS — Z0000 Encounter for general adult medical examination without abnormal findings: Secondary | ICD-10-CM | POA: Diagnosis not present

## 2022-06-04 DIAGNOSIS — E039 Hypothyroidism, unspecified: Secondary | ICD-10-CM | POA: Diagnosis not present

## 2022-06-11 DIAGNOSIS — E039 Hypothyroidism, unspecified: Secondary | ICD-10-CM | POA: Diagnosis not present

## 2022-06-11 DIAGNOSIS — M810 Age-related osteoporosis without current pathological fracture: Secondary | ICD-10-CM | POA: Diagnosis not present

## 2022-06-11 DIAGNOSIS — R5383 Other fatigue: Secondary | ICD-10-CM | POA: Diagnosis not present

## 2022-06-11 DIAGNOSIS — N951 Menopausal and female climacteric states: Secondary | ICD-10-CM | POA: Diagnosis not present

## 2022-07-16 DIAGNOSIS — Z23 Encounter for immunization: Secondary | ICD-10-CM | POA: Diagnosis not present

## 2022-08-24 ENCOUNTER — Ambulatory Visit: Payer: BC Managed Care – PPO | Admitting: Orthopaedic Surgery

## 2022-09-08 DIAGNOSIS — R5383 Other fatigue: Secondary | ICD-10-CM | POA: Diagnosis not present

## 2022-09-08 DIAGNOSIS — E611 Iron deficiency: Secondary | ICD-10-CM | POA: Diagnosis not present

## 2022-09-08 DIAGNOSIS — E039 Hypothyroidism, unspecified: Secondary | ICD-10-CM | POA: Diagnosis not present

## 2022-09-08 DIAGNOSIS — N951 Menopausal and female climacteric states: Secondary | ICD-10-CM | POA: Diagnosis not present

## 2022-09-09 ENCOUNTER — Ambulatory Visit (INDEPENDENT_AMBULATORY_CARE_PROVIDER_SITE_OTHER): Payer: BC Managed Care – PPO | Admitting: Orthopaedic Surgery

## 2022-09-09 DIAGNOSIS — M25552 Pain in left hip: Secondary | ICD-10-CM

## 2022-09-09 NOTE — Progress Notes (Signed)
The patient is a 57 year old athletic female who comes in for continued follow-up as a relates to her left hip pain.  In July of last year she did have an intra-articular steroid injection in that left hip which really did not help much.  She does a lot of bicycle riding as well and travels quite a bit.  We have talked about the potential for hip replacement surgery at some point.  On exam her left hip does have stiffness with internal and external rotation but her right side seems to be having some of that as well and that may be due to overcompensation.  She is a thin individual as well and like I said very athletic and active.  We talked in length in detail about hip replacement surgery.  I would ask her like to see her back in early June of this year and will have a standing low AP pelvis and lateral of both hips at that visit.  We can then talk about surgical planning at that standpoint.  She agrees with this treatment plan.

## 2022-09-17 DIAGNOSIS — R5383 Other fatigue: Secondary | ICD-10-CM | POA: Diagnosis not present

## 2022-09-17 DIAGNOSIS — E039 Hypothyroidism, unspecified: Secondary | ICD-10-CM | POA: Diagnosis not present

## 2022-09-17 DIAGNOSIS — N951 Menopausal and female climacteric states: Secondary | ICD-10-CM | POA: Diagnosis not present

## 2022-09-17 DIAGNOSIS — M810 Age-related osteoporosis without current pathological fracture: Secondary | ICD-10-CM | POA: Diagnosis not present

## 2022-11-05 DIAGNOSIS — Z23 Encounter for immunization: Secondary | ICD-10-CM | POA: Diagnosis not present

## 2022-11-11 DIAGNOSIS — L821 Other seborrheic keratosis: Secondary | ICD-10-CM | POA: Diagnosis not present

## 2022-11-11 DIAGNOSIS — D225 Melanocytic nevi of trunk: Secondary | ICD-10-CM | POA: Diagnosis not present

## 2022-11-11 DIAGNOSIS — L570 Actinic keratosis: Secondary | ICD-10-CM | POA: Diagnosis not present

## 2022-11-11 DIAGNOSIS — D2371 Other benign neoplasm of skin of right lower limb, including hip: Secondary | ICD-10-CM | POA: Diagnosis not present

## 2022-11-11 DIAGNOSIS — L578 Other skin changes due to chronic exposure to nonionizing radiation: Secondary | ICD-10-CM | POA: Diagnosis not present

## 2022-12-10 ENCOUNTER — Ambulatory Visit: Payer: BC Managed Care – PPO | Admitting: Orthopaedic Surgery

## 2022-12-10 DIAGNOSIS — N951 Menopausal and female climacteric states: Secondary | ICD-10-CM | POA: Diagnosis not present

## 2022-12-10 DIAGNOSIS — E039 Hypothyroidism, unspecified: Secondary | ICD-10-CM | POA: Diagnosis not present

## 2022-12-10 DIAGNOSIS — M810 Age-related osteoporosis without current pathological fracture: Secondary | ICD-10-CM | POA: Diagnosis not present

## 2022-12-10 DIAGNOSIS — E611 Iron deficiency: Secondary | ICD-10-CM | POA: Diagnosis not present

## 2022-12-10 DIAGNOSIS — E559 Vitamin D deficiency, unspecified: Secondary | ICD-10-CM | POA: Diagnosis not present

## 2022-12-10 DIAGNOSIS — R5383 Other fatigue: Secondary | ICD-10-CM | POA: Diagnosis not present

## 2022-12-29 DIAGNOSIS — M810 Age-related osteoporosis without current pathological fracture: Secondary | ICD-10-CM | POA: Diagnosis not present

## 2022-12-29 DIAGNOSIS — E611 Iron deficiency: Secondary | ICD-10-CM | POA: Diagnosis not present

## 2022-12-29 DIAGNOSIS — R197 Diarrhea, unspecified: Secondary | ICD-10-CM | POA: Diagnosis not present

## 2022-12-29 DIAGNOSIS — N951 Menopausal and female climacteric states: Secondary | ICD-10-CM | POA: Diagnosis not present

## 2023-01-26 ENCOUNTER — Ambulatory Visit: Payer: BC Managed Care – PPO | Admitting: Orthopaedic Surgery

## 2023-01-26 ENCOUNTER — Other Ambulatory Visit (INDEPENDENT_AMBULATORY_CARE_PROVIDER_SITE_OTHER): Payer: BC Managed Care – PPO

## 2023-01-26 ENCOUNTER — Encounter: Payer: Self-pay | Admitting: Orthopaedic Surgery

## 2023-01-26 DIAGNOSIS — M25552 Pain in left hip: Secondary | ICD-10-CM

## 2023-01-26 NOTE — Progress Notes (Signed)
Office Visit Note   Patient: Carrie Vang           Date of Birth: 06-Sep-1965           MRN: [id number] Visit Date: 01/26/2023              Requested by: Carrie Brunette, MD [id number] [address] [place],  [place] [address] PCP: Carrie Brunette, MD   Assessment & Plan: Visit Diagnoses:  1. Pain in left hip     Plan: Discussed with the patient at length left total hip arthroplasty procedure and postoperative protocol.  Risk benefits of surgery discussed risk include but are not limited to DVT/PE/, wound healing problems, blood loss, infection, leg length discrepancy, nerve or vessel injury.  Handout on total hip arthroplasty is given.  She would like to think about surgery.  She does not feel she is at the point where she wants to schedule surgery but feels that it may be trying close.  Questions were encouraged and answered at length.  Follow-up as needed. Reviewed the radiographs with the patient today.  Follow-Up Instructions: Return if symptoms worsen or fail to improve.   Orders:  Orders Placed This Encounter  Procedures   XR HIPS BILAT W OR W/O PELVIS 2V   No orders of the defined types were placed in this encounter.     Procedures: No procedures performed   Clinical Data: No additional findings.   Subjective: Chief Complaint  Patient presents with   Right Hip - Follow-up   Left Hip - Follow-up    HPI [name] comes in today for left hip pain.  She actually has bilateral hip pain left worse than right.  Ranks her left hip pain be 7-8 out of 10 at its worst.  Right is 5 out of 10 at worst.  She takes ibuprofen for the pain.  She notes she has some trouble exercising doing elliptical and cycling.  She also has some difficulty getting in and out of a car.  She tried an intra-articular injection left hip without any relief.  She does not use cane or walker to ambulate.  No new injury to either hip.  She is mainly here to discuss possible surgical  intervention left hip.  Review of Systems See HPI  Objective: Vital Signs: There were no vitals taken for this visit.  Physical Exam Constitutional:      Appearance: She is normal weight. She is not ill-appearing or diaphoretic.  Pulmonary:     Effort: Pulmonary effort is normal.  Neurological:     Mental Status: She is alert and oriented to person, place, and time.  Psychiatric:        Mood and Affect: Mood normal.     Ortho Exam Bilateral hips fluid range of motion both hips.  Left hip she has pain with extremes of internal/external rotation. Specialty Comments:  No specialty comments available.  Imaging: XR HIPS BILAT W OR W/O PELVIS 2V  Result Date: 01/26/2023 AP pelvis lateral view bilateral hips: No acute fracture.  Bilateral hips well located.  Inferior osteophytes off the femoral head bilaterally.  Moderate narrowing right hip joint moderate to moderately severe narrowing left hip joint.  Right femoral head spherical left femoral head slightly misshapened and oblong.  Otherwise no bony abnormalities.    PMFS History: Patient Active Problem List   Diagnosis Date Noted   Genetic testing 05/18/2020   Family history of breast cancer    Family history  of oral cancer    Family history of throat cancer    Family history of lung cancer    Past Medical History:  Diagnosis Date   Chest pain    2014 rt side pain. Pt denies any chest pain, dizziness or sob since 2014   Family history of breast cancer    Family history of lung cancer    Family history of oral cancer    Family history of throat cancer    Hypothyroidism     Family History  Problem Relation Age of Onset   Breast cancer Mother 49   Cancer Mother 62       oral   Throat cancer Maternal Grandfather 48   Lung cancer Maternal Grandfather 10   Stroke Paternal Grandmother        multiple   Heart attack Paternal Grandfather    Factor V Leiden deficiency Cousin        paternal first cousins    Past  Surgical History:  Procedure Laterality Date   BREAST LUMPECTOMY WITH RADIOACTIVE SEED LOCALIZATION Left 06/12/2020   Procedure: LEFT BREAST LUMPECTOMY WITH RADIOACTIVE SEED LOCALIZATION;  Surgeon: Abigail Miyamoto, MD;  Location: [facility];  Service: General;  Laterality: Left;   Social History   Occupational History   Not on file  Tobacco Use   Smoking status: Never   Smokeless tobacco: Never  Vaping Use   Vaping status: Never Used  Substance and Sexual Activity   Alcohol use: Yes    Alcohol/week: 5.0 standard drinks of alcohol    Types: 5 Glasses of wine per week   Drug use: Never   Sexual activity: Not on file

## 2023-02-08 DIAGNOSIS — K900 Celiac disease: Secondary | ICD-10-CM | POA: Diagnosis not present

## 2023-02-08 DIAGNOSIS — K52832 Lymphocytic colitis: Secondary | ICD-10-CM | POA: Diagnosis not present

## 2023-02-27 DIAGNOSIS — N390 Urinary tract infection, site not specified: Secondary | ICD-10-CM | POA: Diagnosis not present

## 2023-03-17 DIAGNOSIS — E538 Deficiency of other specified B group vitamins: Secondary | ICD-10-CM | POA: Diagnosis not present

## 2023-03-17 DIAGNOSIS — E611 Iron deficiency: Secondary | ICD-10-CM | POA: Diagnosis not present

## 2023-03-17 DIAGNOSIS — E559 Vitamin D deficiency, unspecified: Secondary | ICD-10-CM | POA: Diagnosis not present

## 2023-03-17 DIAGNOSIS — E039 Hypothyroidism, unspecified: Secondary | ICD-10-CM | POA: Diagnosis not present

## 2023-03-17 DIAGNOSIS — M810 Age-related osteoporosis without current pathological fracture: Secondary | ICD-10-CM | POA: Diagnosis not present

## 2023-03-17 DIAGNOSIS — N951 Menopausal and female climacteric states: Secondary | ICD-10-CM | POA: Diagnosis not present

## 2023-03-17 DIAGNOSIS — R5383 Other fatigue: Secondary | ICD-10-CM | POA: Diagnosis not present

## 2023-03-23 DIAGNOSIS — R197 Diarrhea, unspecified: Secondary | ICD-10-CM | POA: Diagnosis not present

## 2023-03-23 DIAGNOSIS — R300 Dysuria: Secondary | ICD-10-CM | POA: Diagnosis not present

## 2023-03-23 DIAGNOSIS — M810 Age-related osteoporosis without current pathological fracture: Secondary | ICD-10-CM | POA: Diagnosis not present

## 2023-03-23 DIAGNOSIS — E611 Iron deficiency: Secondary | ICD-10-CM | POA: Diagnosis not present

## 2023-03-23 DIAGNOSIS — N951 Menopausal and female climacteric states: Secondary | ICD-10-CM | POA: Diagnosis not present

## 2023-04-06 DIAGNOSIS — K2930 Chronic superficial gastritis without bleeding: Secondary | ICD-10-CM | POA: Diagnosis not present

## 2023-04-06 DIAGNOSIS — K31A19 Gastric intestinal metaplasia without dysplasia, unspecified site: Secondary | ICD-10-CM | POA: Diagnosis not present

## 2023-04-06 DIAGNOSIS — K2940 Chronic atrophic gastritis without bleeding: Secondary | ICD-10-CM | POA: Diagnosis not present

## 2023-04-06 DIAGNOSIS — K900 Celiac disease: Secondary | ICD-10-CM | POA: Diagnosis not present

## 2023-04-06 DIAGNOSIS — K3189 Other diseases of stomach and duodenum: Secondary | ICD-10-CM | POA: Diagnosis not present

## 2023-04-06 DIAGNOSIS — K52839 Microscopic colitis, unspecified: Secondary | ICD-10-CM | POA: Diagnosis not present

## 2023-04-06 DIAGNOSIS — K648 Other hemorrhoids: Secondary | ICD-10-CM | POA: Diagnosis not present

## 2023-04-06 DIAGNOSIS — K2990 Gastroduodenitis, unspecified, without bleeding: Secondary | ICD-10-CM | POA: Diagnosis not present

## 2023-04-06 DIAGNOSIS — K52832 Lymphocytic colitis: Secondary | ICD-10-CM | POA: Diagnosis not present

## 2023-04-06 DIAGNOSIS — K31A12 Gastric intestinal metaplasia without dysplasia, involving the body (corpus): Secondary | ICD-10-CM | POA: Diagnosis not present

## 2023-04-21 DIAGNOSIS — K52832 Lymphocytic colitis: Secondary | ICD-10-CM | POA: Diagnosis not present

## 2023-04-21 DIAGNOSIS — K31A0 Gastric intestinal metaplasia, unspecified: Secondary | ICD-10-CM | POA: Diagnosis not present

## 2023-04-21 DIAGNOSIS — K900 Celiac disease: Secondary | ICD-10-CM | POA: Diagnosis not present

## 2023-05-25 DIAGNOSIS — Z01419 Encounter for gynecological examination (general) (routine) without abnormal findings: Secondary | ICD-10-CM | POA: Diagnosis not present

## 2023-05-25 DIAGNOSIS — Z1231 Encounter for screening mammogram for malignant neoplasm of breast: Secondary | ICD-10-CM | POA: Diagnosis not present

## 2023-05-25 DIAGNOSIS — Z6822 Body mass index (BMI) 22.0-22.9, adult: Secondary | ICD-10-CM | POA: Diagnosis not present

## 2023-07-15 DIAGNOSIS — Z0000 Encounter for general adult medical examination without abnormal findings: Secondary | ICD-10-CM | POA: Diagnosis not present

## 2023-07-15 DIAGNOSIS — M79671 Pain in right foot: Secondary | ICD-10-CM | POA: Diagnosis not present

## 2023-07-15 DIAGNOSIS — F5101 Primary insomnia: Secondary | ICD-10-CM | POA: Diagnosis not present

## 2023-07-15 DIAGNOSIS — E039 Hypothyroidism, unspecified: Secondary | ICD-10-CM | POA: Diagnosis not present

## 2023-07-15 DIAGNOSIS — E782 Mixed hyperlipidemia: Secondary | ICD-10-CM | POA: Diagnosis not present

## 2023-08-24 DIAGNOSIS — Z13820 Encounter for screening for osteoporosis: Secondary | ICD-10-CM | POA: Diagnosis not present

## 2023-09-17 DIAGNOSIS — E611 Iron deficiency: Secondary | ICD-10-CM | POA: Diagnosis not present

## 2023-09-17 DIAGNOSIS — E039 Hypothyroidism, unspecified: Secondary | ICD-10-CM | POA: Diagnosis not present

## 2023-09-17 DIAGNOSIS — N951 Menopausal and female climacteric states: Secondary | ICD-10-CM | POA: Diagnosis not present

## 2023-09-17 DIAGNOSIS — E559 Vitamin D deficiency, unspecified: Secondary | ICD-10-CM | POA: Diagnosis not present

## 2023-09-17 DIAGNOSIS — R5383 Other fatigue: Secondary | ICD-10-CM | POA: Diagnosis not present

## 2023-09-17 DIAGNOSIS — M810 Age-related osteoporosis without current pathological fracture: Secondary | ICD-10-CM | POA: Diagnosis not present

## 2023-10-06 DIAGNOSIS — N951 Menopausal and female climacteric states: Secondary | ICD-10-CM | POA: Diagnosis not present

## 2023-10-06 DIAGNOSIS — E611 Iron deficiency: Secondary | ICD-10-CM | POA: Diagnosis not present

## 2023-10-06 DIAGNOSIS — R197 Diarrhea, unspecified: Secondary | ICD-10-CM | POA: Diagnosis not present

## 2023-10-06 DIAGNOSIS — M810 Age-related osteoporosis without current pathological fracture: Secondary | ICD-10-CM | POA: Diagnosis not present

## 2023-10-21 DIAGNOSIS — R42 Dizziness and giddiness: Secondary | ICD-10-CM | POA: Diagnosis not present

## 2023-10-21 DIAGNOSIS — R5383 Other fatigue: Secondary | ICD-10-CM | POA: Diagnosis not present

## 2023-11-16 ENCOUNTER — Encounter (HOSPITAL_BASED_OUTPATIENT_CLINIC_OR_DEPARTMENT_OTHER): Admitting: Obstetrics & Gynecology

## 2023-12-07 DIAGNOSIS — L820 Inflamed seborrheic keratosis: Secondary | ICD-10-CM | POA: Diagnosis not present

## 2023-12-07 DIAGNOSIS — L570 Actinic keratosis: Secondary | ICD-10-CM | POA: Diagnosis not present

## 2024-03-30 DIAGNOSIS — L570 Actinic keratosis: Secondary | ICD-10-CM | POA: Diagnosis not present

## 2024-03-30 DIAGNOSIS — L821 Other seborrheic keratosis: Secondary | ICD-10-CM | POA: Diagnosis not present

## 2024-03-30 DIAGNOSIS — D2371 Other benign neoplasm of skin of right lower limb, including hip: Secondary | ICD-10-CM | POA: Diagnosis not present

## 2024-03-30 DIAGNOSIS — D225 Melanocytic nevi of trunk: Secondary | ICD-10-CM | POA: Diagnosis not present

## 2024-03-30 DIAGNOSIS — L578 Other skin changes due to chronic exposure to nonionizing radiation: Secondary | ICD-10-CM | POA: Diagnosis not present

## 2024-04-03 DIAGNOSIS — M5382 Other specified dorsopathies, cervical region: Secondary | ICD-10-CM | POA: Diagnosis not present

## 2024-04-03 DIAGNOSIS — M9901 Segmental and somatic dysfunction of cervical region: Secondary | ICD-10-CM | POA: Diagnosis not present

## 2024-04-03 DIAGNOSIS — M9902 Segmental and somatic dysfunction of thoracic region: Secondary | ICD-10-CM | POA: Diagnosis not present

## 2024-04-03 DIAGNOSIS — M5432 Sciatica, left side: Secondary | ICD-10-CM | POA: Diagnosis not present

## 2024-04-05 DIAGNOSIS — Z1321 Encounter for screening for nutritional disorder: Secondary | ICD-10-CM | POA: Diagnosis not present

## 2024-04-05 DIAGNOSIS — N951 Menopausal and female climacteric states: Secondary | ICD-10-CM | POA: Diagnosis not present

## 2024-04-05 DIAGNOSIS — M810 Age-related osteoporosis without current pathological fracture: Secondary | ICD-10-CM | POA: Diagnosis not present

## 2024-04-05 DIAGNOSIS — E039 Hypothyroidism, unspecified: Secondary | ICD-10-CM | POA: Diagnosis not present

## 2024-04-05 DIAGNOSIS — R5383 Other fatigue: Secondary | ICD-10-CM | POA: Diagnosis not present

## 2024-04-05 DIAGNOSIS — E611 Iron deficiency: Secondary | ICD-10-CM | POA: Diagnosis not present

## 2024-04-05 DIAGNOSIS — E559 Vitamin D deficiency, unspecified: Secondary | ICD-10-CM | POA: Diagnosis not present

## 2024-04-10 DIAGNOSIS — M5382 Other specified dorsopathies, cervical region: Secondary | ICD-10-CM | POA: Diagnosis not present

## 2024-04-10 DIAGNOSIS — M9901 Segmental and somatic dysfunction of cervical region: Secondary | ICD-10-CM | POA: Diagnosis not present

## 2024-04-10 DIAGNOSIS — M5432 Sciatica, left side: Secondary | ICD-10-CM | POA: Diagnosis not present

## 2024-04-10 DIAGNOSIS — M9902 Segmental and somatic dysfunction of thoracic region: Secondary | ICD-10-CM | POA: Diagnosis not present

## 2024-04-20 DIAGNOSIS — M5382 Other specified dorsopathies, cervical region: Secondary | ICD-10-CM | POA: Diagnosis not present

## 2024-04-20 DIAGNOSIS — M5432 Sciatica, left side: Secondary | ICD-10-CM | POA: Diagnosis not present

## 2024-04-20 DIAGNOSIS — M9901 Segmental and somatic dysfunction of cervical region: Secondary | ICD-10-CM | POA: Diagnosis not present

## 2024-04-20 DIAGNOSIS — M9902 Segmental and somatic dysfunction of thoracic region: Secondary | ICD-10-CM | POA: Diagnosis not present

## 2024-04-26 DIAGNOSIS — K31A0 Gastric intestinal metaplasia, unspecified: Secondary | ICD-10-CM | POA: Diagnosis not present

## 2024-04-26 DIAGNOSIS — K5289 Other specified noninfective gastroenteritis and colitis: Secondary | ICD-10-CM | POA: Diagnosis not present

## 2024-04-26 DIAGNOSIS — K900 Celiac disease: Secondary | ICD-10-CM | POA: Diagnosis not present

## 2024-05-10 DIAGNOSIS — E039 Hypothyroidism, unspecified: Secondary | ICD-10-CM | POA: Diagnosis not present

## 2024-05-10 DIAGNOSIS — R5383 Other fatigue: Secondary | ICD-10-CM | POA: Diagnosis not present

## 2024-05-18 ENCOUNTER — Encounter (HOSPITAL_BASED_OUTPATIENT_CLINIC_OR_DEPARTMENT_OTHER): Admitting: Obstetrics & Gynecology

## 2024-05-18 ENCOUNTER — Encounter (HOSPITAL_BASED_OUTPATIENT_CLINIC_OR_DEPARTMENT_OTHER): Admitting: Obstetrics and Gynecology

## 2024-05-29 DIAGNOSIS — L0889 Other specified local infections of the skin and subcutaneous tissue: Secondary | ICD-10-CM | POA: Diagnosis not present

## 2024-06-07 DIAGNOSIS — M9902 Segmental and somatic dysfunction of thoracic region: Secondary | ICD-10-CM | POA: Diagnosis not present

## 2024-06-07 DIAGNOSIS — M5382 Other specified dorsopathies, cervical region: Secondary | ICD-10-CM | POA: Diagnosis not present

## 2024-06-07 DIAGNOSIS — M5432 Sciatica, left side: Secondary | ICD-10-CM | POA: Diagnosis not present

## 2024-06-07 DIAGNOSIS — M9901 Segmental and somatic dysfunction of cervical region: Secondary | ICD-10-CM | POA: Diagnosis not present

## 2024-06-14 DIAGNOSIS — M9902 Segmental and somatic dysfunction of thoracic region: Secondary | ICD-10-CM | POA: Diagnosis not present

## 2024-06-14 DIAGNOSIS — M5432 Sciatica, left side: Secondary | ICD-10-CM | POA: Diagnosis not present

## 2024-06-14 DIAGNOSIS — M9901 Segmental and somatic dysfunction of cervical region: Secondary | ICD-10-CM | POA: Diagnosis not present

## 2024-06-14 DIAGNOSIS — M5382 Other specified dorsopathies, cervical region: Secondary | ICD-10-CM | POA: Diagnosis not present

## 2024-06-16 ENCOUNTER — Encounter (HOSPITAL_BASED_OUTPATIENT_CLINIC_OR_DEPARTMENT_OTHER): Admitting: Certified Nurse Midwife

## 2024-06-20 ENCOUNTER — Encounter (HOSPITAL_BASED_OUTPATIENT_CLINIC_OR_DEPARTMENT_OTHER): Payer: Self-pay | Admitting: Obstetrics and Gynecology

## 2024-06-20 ENCOUNTER — Ambulatory Visit (HOSPITAL_BASED_OUTPATIENT_CLINIC_OR_DEPARTMENT_OTHER): Admitting: Obstetrics and Gynecology

## 2024-06-20 VITALS — BP 99/61 | HR 73 | Ht 67.0 in | Wt 143.8 lb

## 2024-06-20 DIAGNOSIS — Z1231 Encounter for screening mammogram for malignant neoplasm of breast: Secondary | ICD-10-CM

## 2024-06-20 DIAGNOSIS — Z79890 Hormone replacement therapy (postmenopausal): Secondary | ICD-10-CM

## 2024-06-20 DIAGNOSIS — Z1331 Encounter for screening for depression: Secondary | ICD-10-CM | POA: Diagnosis not present

## 2024-06-20 DIAGNOSIS — Z01419 Encounter for gynecological examination (general) (routine) without abnormal findings: Secondary | ICD-10-CM | POA: Diagnosis not present

## 2024-06-20 DIAGNOSIS — Z9889 Other specified postprocedural states: Secondary | ICD-10-CM | POA: Diagnosis not present

## 2024-06-20 NOTE — Progress Notes (Signed)
" ° °  ANNUAL EXAM Patient name: Carrie Vang MRN [id number]  Date of birth: 25-May-1966 Chief Complaint:   New Patient (Initial Visit) (Establish Care)  History of Present Illness:   Carrie Vang is a 58 y.o. [phone]  female being seen today for a routine annual exam.  Current complaints: She sees [facility] and is currently on vivelle  dot, progesterone  200mg  nightly, and testosterone   She had a lumpectomy in her left breast in 2021, results benign.  She endorses vaginal dryness   No LMP recorded. Patient is postmenopausal.  Gynecologic History Contraception: postmenopausal  Last Pap: Results were: Normal Last mammogram:2024 . Results were:reports results were normal      06/20/2024    2:24 PM  Depression screen PHQ 2/9  Decreased Interest 0  Down, Depressed, Hopeless 0  PHQ - 2 Score 0  Altered sleeping 0  Tired, decreased energy 0  Change in appetite 0  Feeling bad or failure about yourself  0  Trouble concentrating 0  Moving slowly or fidgety/restless 0  Suicidal thoughts 0  PHQ-9 Score 0        06/20/2024    2:24 PM  GAD 7 : Generalized Anxiety Score  Nervous, Anxious, on Edge 0  Control/stop worrying 0  Worry too much - different things 0  Trouble relaxing 0  Restless 0  Easily annoyed or irritable 0  Afraid - awful might happen 0  Total GAD 7 Score 0    Review of Systems:   Pertinent items are noted in HPI Denies any headaches, blurred vision, fatigue, shortness of breath, chest pain, abdominal pain, abnormal vaginal discharge/itching/odor/irritation, problems with periods, bowel movements, urination, or intercourse unless otherwise stated above. Pertinent History Reviewed:  Reviewed past medical,surgical, social and family history.  Reviewed problem list, medications and allergies. Physical Assessment:   Vitals:   06/20/24 1417  BP: 99/61  Pulse: 73  Weight: 143 lb 12.8 oz (65.2 kg)  Height: 5' 7 (1.702 m)  Body mass index is  22.52 kg/m.        Physical Examination:   General appearance - well appearing, and in no distress  Mental status - alert, oriented   Psych:  She has a normal mood and affect  Skin - warm and dry, normal color  Chest - effort normal, all lung fields clear to auscultation bilaterally  Heart - normal rate and regular rhythm  Neck:  midline trachea, no thyromegaly or nodules  Breasts -declined  Pelvic - deferred  Extremities:  No swelling or varicosities noted  Chaperone present for exam  No results found for this or any previous visit (from the past 24 hours).  Assessment & Plan:  1. Encounter for annual routine gynecological examination (Primary) Requests pap smear records from physician for women -around 2 years Plan mammogram Discussed options for dryness to include Replense, coconut oil, vaginal estrogen  Continue HRT-[facility]  2. Encounter for screening mammogram for malignant neoplasm of breast Encourage to schedule mammogram - MM 3D SCREENING MAMMOGRAM BILATERAL BREAST; Future   Labs/procedures today:   Mammogram: schedule screening mammo as soon as possible, or sooner if problems Colonoscopy: per GI, or sooner if problems  Orders Placed This Encounter  Procedures   MM 3D SCREENING MAMMOGRAM BILATERAL BREAST    Meds: No orders of the defined types were placed in this encounter.   Follow-up: Return in about 1 year (around 06/20/2025) for [place] Nidia Delores, FNP  "

## 2024-06-22 DIAGNOSIS — Z713 Dietary counseling and surveillance: Secondary | ICD-10-CM | POA: Diagnosis not present

## 2024-06-23 DIAGNOSIS — E785 Hyperlipidemia, unspecified: Secondary | ICD-10-CM | POA: Diagnosis not present

## 2024-06-23 DIAGNOSIS — E039 Hypothyroidism, unspecified: Secondary | ICD-10-CM | POA: Diagnosis not present

## 2024-07-03 DIAGNOSIS — Z713 Dietary counseling and surveillance: Secondary | ICD-10-CM | POA: Diagnosis not present

## 2024-07-20 ENCOUNTER — Ambulatory Visit
Admission: RE | Admit: 2024-07-20 | Discharge: 2024-07-20 | Disposition: A | Source: Ambulatory Visit | Attending: Obstetrics and Gynecology

## 2024-07-20 DIAGNOSIS — Z1231 Encounter for screening mammogram for malignant neoplasm of breast: Secondary | ICD-10-CM

## 2024-07-24 ENCOUNTER — Ambulatory Visit: Payer: Self-pay | Admitting: Obstetrics and Gynecology

## 2024-07-27 ENCOUNTER — Other Ambulatory Visit (HOSPITAL_BASED_OUTPATIENT_CLINIC_OR_DEPARTMENT_OTHER): Payer: Self-pay

## 2024-07-27 DIAGNOSIS — Z01419 Encounter for gynecological examination (general) (routine) without abnormal findings: Secondary | ICD-10-CM

## 2024-07-27 MED ORDER — ESTRADIOL 0.0375 MG/24HR TD PTTW: 1.0000 | MEDICATED_PATCH | TRANSDERMAL | 2 refills | Status: AC
